# Patient Record
Sex: Male | Born: 1940 | Race: White | Hispanic: No | Smoking: Never smoker
Health system: Southern US, Community
[De-identification: ages and names within clinical notes are randomized; demographics above are authoritative.]

## PROBLEM LIST (undated history)

## (undated) ENCOUNTER — Emergency Department (HOSPITAL_BASED_OUTPATIENT_CLINIC_OR_DEPARTMENT_OTHER): Admission: EM | Payer: Medicare Other | Source: Home / Self Care

## (undated) DIAGNOSIS — E785 Hyperlipidemia, unspecified: Secondary | ICD-10-CM

## (undated) DIAGNOSIS — Z9989 Dependence on other enabling machines and devices: Secondary | ICD-10-CM

## (undated) DIAGNOSIS — F0390 Unspecified dementia without behavioral disturbance: Secondary | ICD-10-CM

## (undated) DIAGNOSIS — G4733 Obstructive sleep apnea (adult) (pediatric): Secondary | ICD-10-CM

## (undated) DIAGNOSIS — I10 Essential (primary) hypertension: Secondary | ICD-10-CM

## (undated) HISTORY — DX: Unspecified dementia, unspecified severity, without behavioral disturbance, psychotic disturbance, mood disturbance, and anxiety: F03.90

## (undated) HISTORY — DX: Obstructive sleep apnea (adult) (pediatric): G47.33

## (undated) HISTORY — DX: Hyperlipidemia, unspecified: E78.5

## (undated) HISTORY — PX: THYROID SURGERY: SHX805

---
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
## 1998-10-15 ENCOUNTER — Ambulatory Visit (HOSPITAL_COMMUNITY): Admission: RE | Admit: 1998-10-15 | Discharge: 1998-10-15 | Payer: Self-pay | Admitting: *Deleted

## 2001-01-08 ENCOUNTER — Encounter (INDEPENDENT_AMBULATORY_CARE_PROVIDER_SITE_OTHER): Payer: Self-pay | Admitting: Specialist

## 2001-01-08 ENCOUNTER — Other Ambulatory Visit: Admission: RE | Admit: 2001-01-08 | Discharge: 2001-01-08 | Payer: Self-pay | Admitting: Urology

## 2004-05-23 ENCOUNTER — Encounter (INDEPENDENT_AMBULATORY_CARE_PROVIDER_SITE_OTHER): Payer: Self-pay | Admitting: *Deleted

## 2004-05-23 ENCOUNTER — Ambulatory Visit (HOSPITAL_COMMUNITY): Admission: RE | Admit: 2004-05-23 | Discharge: 2004-05-23 | Payer: Self-pay | Admitting: *Deleted

## 2005-07-21 ENCOUNTER — Ambulatory Visit (HOSPITAL_COMMUNITY): Admission: RE | Admit: 2005-07-21 | Discharge: 2005-07-21 | Payer: Self-pay | Admitting: *Deleted

## 2005-07-21 ENCOUNTER — Encounter (INDEPENDENT_AMBULATORY_CARE_PROVIDER_SITE_OTHER): Payer: Self-pay | Admitting: *Deleted

## 2008-06-28 ENCOUNTER — Ambulatory Visit (HOSPITAL_COMMUNITY): Admission: RE | Admit: 2008-06-28 | Discharge: 2008-06-28 | Payer: Self-pay | Admitting: *Deleted

## 2008-06-28 ENCOUNTER — Encounter (INDEPENDENT_AMBULATORY_CARE_PROVIDER_SITE_OTHER): Payer: Self-pay | Admitting: *Deleted

## 2009-01-15 ENCOUNTER — Emergency Department (HOSPITAL_COMMUNITY): Admission: EM | Admit: 2009-01-15 | Discharge: 2009-01-15 | Payer: Self-pay | Admitting: Emergency Medicine

## 2010-03-28 ENCOUNTER — Encounter: Admission: RE | Admit: 2010-03-28 | Discharge: 2010-03-28 | Payer: Self-pay | Admitting: General Surgery

## 2011-01-21 LAB — URINALYSIS, ROUTINE W REFLEX MICROSCOPIC
Bilirubin Urine: NEGATIVE
Glucose, UA: NEGATIVE mg/dL
Leukocytes, UA: NEGATIVE
Urobilinogen, UA: 0.2 mg/dL (ref 0.0–1.0)
pH: 5 (ref 5.0–8.0)

## 2011-02-24 NOTE — Op Note (Signed)
NAMEDONNY, HEFFERN NO.:  [id number]   MEDICAL RECORD NO.:  [id number]          PATIENT TYPE:  AMB   LOCATION:  ENDO                         FACILITY:  [facility]   PHYSICIAN:  Georgiana Spinner, M.D.    DATE OF BIRTH:  03/19/1941   DATE OF PROCEDURE:  06/28/2008  DATE OF DISCHARGE:                               OPERATIVE REPORT   PROCEDURE:  Upper endoscopy with biopsy.   INDICATIONS:  GERD with Barrett's esophagus.   ANESTHESIA:  Fentanyl 60 mcg, Versed 6 mg, Phenergan 12 mg.   PROCEDURE:  With the patient mildly sedated in the left lateral  decubitus position, the Pentax videoscopic endoscope was inserted and  passed under direct vision through the esophagus which appeared normal  until we reached distal esophagus there were changes of Barrett's  photographed and biopsied.  We entered into the stomach.  Fundus, body,  antrum, duodenal bulb, second portion duodenum were visualized.  From  this point the endoscope was slowly withdrawn taking circumferential  views of duodenal mucosa until the endoscope had been pulled back into  the stomach and placed on retroflexion to view the stomach from below.  The endoscope was straightened and withdrawn taking circumferential  views remaining gastric and esophageal mucosa.  The patient's vital  signs, pulse oximeter remained stable.  The patient tolerated procedure  well without apparent complication.   FINDINGS:  Barrett's esophagus biopsied.  Await biopsy report.  The  patient will call me for results and follow-up with me as an outpatient.           ______________________________  Georgiana Spinner, M.D.     GMO/MEDQ  D:  06/28/2008  T:  06/30/2008  Job:  [id number]

## 2011-02-27 NOTE — Op Note (Signed)
NAMEBEAUX, WEDEMEYER NO.:  [id number]   MEDICAL RECORD NO.:  [id number]          PATIENT TYPE:  AMB   LOCATION:  ENDO                         FACILITY:  [facility]   PHYSICIAN:  Georgiana Spinner, M.D.    DATE OF BIRTH:  04/27/1941   DATE OF PROCEDURE:  DATE OF DISCHARGE:                                 OPERATIVE REPORT   PROCEDURE:  Upper endoscopy with biopsy.   INDICATIONS:  GERD.   ANESTHESIA:  Demerol 60, Versed 6 mg, Phenergan 12.5 mg was given  preprocedure to prevent nausea.   PROCEDURE:  With the patient mildly sedated in the left lateral decubitus  position, the Olympus videoscopic endoscope was inserted in the mouth and  passed under direct vision through the esophagus which appeared normal,  until we  reached distal esophagus and there was a question of a tongue of  Barrett's esophagus photographed and biopsied.  We entered into the stomach.  Fundus, body, antrum, duodenal bulb, second portion duodenum were  visualized. From this point, the endoscope was slowly withdrawn taking  circumferential views of duodenal mucosa until the endoscope had been pulled  back into the stomach and placed in retroflexion to view the stomach from  below.  The endoscope was then straightened and withdrawn, taking  circumferential views of the remaining gastric and esophageal mucosa,  stopping in the body of the stomach to photograph and biopsy an erythematous  area that had a measles-like erythematous pattern. The patient's vital  signs, pulse oximeter remained stable. The patient tolerated procedure well  without apparent complications.   FINDINGS:  Question of Barrett's esophagus, biopsied.  Erythematous gastric  area, biopsied. Await biopsy reports. The patient will call me for results  and follow-up with me as an outpatient.           ______________________________  Georgiana Spinner, M.D.     GMO/MEDQ  D:  07/21/2005  T:  07/21/2005  Job:  [id number]

## 2011-02-27 NOTE — Op Note (Signed)
NAME:  Edward Conway, Edward Conway                       ACCOUNT NO.:  [id number]   MEDICAL RECORD NO.:  [id number]                   PATIENT TYPE:  AMB   LOCATION:  ENDO                                 FACILITY:  [facility]   PHYSICIAN:  Georgiana Spinner, M.D.                 DATE OF BIRTH:  1941/08/09   DATE OF PROCEDURE:  05/23/2004  DATE OF DISCHARGE:                                 OPERATIVE REPORT   PROCEDURE:  Colonoscopy.   INDICATIONS FOR PROCEDURE:  Colon polyps, colon cancer screening and family  history of colon cancer.   ANESTHESIA:  None further given.   DESCRIPTION OF PROCEDURE:  With the patient mildly sedated in the left  lateral decubitus position, a rectal exam was performed which was  unremarkable.  Subsequently the Olympus videoscopic colonoscope was inserted  in the rectum and passed under direct vision to the cecum identified by the  ileocecal valve and appendiceal orifice both of which were photographed.  From this point after clearing the cecum of fecal debris, the colonoscope  was slowly withdrawn taking circumferential views of the colonic mucosa  stopping only in the rectum which appeared normal on direct and retroflexed  view. The endoscope was straightened and withdrawn. The patient's vital  signs and pulse oximeter remained stable. The patient tolerated the  procedure well without apparent complications.   FINDINGS:  Unremarkable colonoscopic examination.   PLAN:  Repeat examination in five years.                                               Georgiana Spinner, M.D.    GMO/MEDQ  D:  05/23/2004  T:  05/23/2004  Job:  [id number]

## 2011-02-27 NOTE — Op Note (Signed)
NAME:  Edward Conway, Edward Conway                       ACCOUNT NO.:  [id number]   MEDICAL RECORD NO.:  [id number]                   PATIENT TYPE:  AMB   LOCATION:  ENDO                                 [facility]ACILITY[facility]   PHYSICIAN:  Georgiana Spinner, M.D.                 DATE OF BIRTH:  February 06, 1941   DATE OF PROCEDURE:  DATE OF DISCHARGE:                                 OPERATIVE REPORT   PROCEDURE:  Upper endoscopy.   INDICATIONS FOR PROCEDURE:  Gastroesophageal reflux disease.   ANESTHESIA:  Demerol 50, Versed 5, Phenergan 25 mg.   DESCRIPTION OF PROCEDURE:  With the patient mildly sedated in the left  lateral decubitus position, the Olympus videoscopic endoscope was inserted  in the mouth, passed under direct vision through the esophagus which  appeared normal until we reached the distal esophagus and there was a  question of Barrett's photographed and biopsied. We entered into the  stomach.  The fundus, body, antrum, duodenal bulb, second portion of the  duodenum all appeared normal. From this point, the endoscope was slowly  withdrawn taking circumferential views of the duodenal mucosa until the  endoscope was pulled back in the stomach, placed in retroflexion to view the  stomach from below. The endoscope was straightened and withdrawn, taking  circumferential views of the remaining gastric and esophageal mucosa.  The  patient's vital signs and pulse oximeter remained stable.  The patient  tolerated the procedure well without apparent complications.   FINDINGS:  Question of short segment Barrett's esophagus biopsied. Await  biopsy report. The patient will call me for results and followup with me as  an outpatient.                                               Georgiana Spinner, M.D.    GMO/MEDQ  D:  05/23/2004  T:  05/23/2004  Job:  [id number]

## 2013-03-22 ENCOUNTER — Other Ambulatory Visit: Payer: Self-pay | Admitting: Endocrinology

## 2013-03-22 DIAGNOSIS — R350 Frequency of micturition: Secondary | ICD-10-CM

## 2013-03-22 DIAGNOSIS — R109 Unspecified abdominal pain: Secondary | ICD-10-CM

## 2013-03-24 ENCOUNTER — Ambulatory Visit
Admission: RE | Admit: 2013-03-24 | Discharge: 2013-03-24 | Disposition: A | Discharge status: Home / Self Care | Payer: Medicare Other | Source: Ambulatory Visit | Attending: Endocrinology | Admitting: Endocrinology

## 2013-03-24 DIAGNOSIS — R109 Unspecified abdominal pain: Secondary | ICD-10-CM

## 2013-03-24 DIAGNOSIS — R350 Frequency of micturition: Secondary | ICD-10-CM

## 2013-03-24 MED ORDER — IOHEXOL 300 MG/ML  SOLN
100.0000 mL | Freq: Once | INTRAMUSCULAR | Status: AC | PRN
  Administered 2013-03-24: 100 mL via INTRAVENOUS

## 2014-02-02 ENCOUNTER — Other Ambulatory Visit: Payer: Self-pay | Admitting: Gastroenterology

## 2016-11-03 ENCOUNTER — Other Ambulatory Visit: Payer: Self-pay | Admitting: Dermatology

## 2017-02-23 ENCOUNTER — Emergency Department
Admission: EM | Admit: 2017-02-23 | Discharge: 2017-02-23 | Disposition: A | Discharge status: Home / Self Care | Payer: Medicare Other | Attending: Emergency Medicine | Admitting: Emergency Medicine

## 2017-02-23 ENCOUNTER — Encounter: Payer: Self-pay | Admitting: Emergency Medicine

## 2017-02-23 DIAGNOSIS — Y929 Unspecified place or not applicable: Secondary | ICD-10-CM | POA: Diagnosis not present

## 2017-02-23 DIAGNOSIS — H5789 Other specified disorders of eye and adnexa: Secondary | ICD-10-CM

## 2017-02-23 DIAGNOSIS — T1591XA Foreign body on external eye, part unspecified, right eye, initial encounter: Secondary | ICD-10-CM

## 2017-02-23 DIAGNOSIS — X58XXXA Exposure to other specified factors, initial encounter: Secondary | ICD-10-CM | POA: Diagnosis not present

## 2017-02-23 DIAGNOSIS — T1501XA Foreign body in cornea, right eye, initial encounter: Secondary | ICD-10-CM | POA: Diagnosis present

## 2017-02-23 DIAGNOSIS — Y9389 Activity, other specified: Secondary | ICD-10-CM | POA: Diagnosis not present

## 2017-02-23 DIAGNOSIS — I10 Essential (primary) hypertension: Secondary | ICD-10-CM | POA: Insufficient documentation

## 2017-02-23 DIAGNOSIS — Y999 Unspecified external cause status: Secondary | ICD-10-CM | POA: Insufficient documentation

## 2017-02-23 HISTORY — DX: Essential (primary) hypertension: I10

## 2017-02-23 MED ORDER — FLUORESCEIN SODIUM 0.6 MG OP STRP
1.0000 | ORAL_STRIP | Freq: Once | OPHTHALMIC | Status: AC
  Administered 2017-02-23: 1 via OPHTHALMIC
  Filled 2017-02-23: qty 1

## 2017-02-23 MED ORDER — TETRACAINE HCL 0.5 % OP SOLN
1.0000 [drp] | Freq: Once | OPHTHALMIC | Status: AC
  Administered 2017-02-23: 1 [drp] via OPHTHALMIC
  Filled 2017-02-23: qty 2

## 2017-02-23 MED ORDER — ERYTHROMYCIN 5 MG/GM OP OINT: TOPICAL_OINTMENT | Freq: Three times a day (TID) | OPHTHALMIC | 0 refills | Status: AC

## 2017-02-23 NOTE — ED Triage Notes (Signed)
Pt states was mowing grass today and reports felt something in his eye tonight. States has tried to flush it out with no relief.

## 2017-02-23 NOTE — ED Provider Notes (Signed)
[facility] Emergency Department Provider Note   ____________________________________________   First MD Initiated Contact with Patient 02/23/17 [phone]     (approximate)  I have reviewed the triage vital signs and the nursing notes.   HISTORY  Chief Complaint Eye Pain    HPI Edward Conway is a 76 y.o. male who comes into the hospital today with a foreign body sensation in his right eye. The patient states he was cutting grass this evening at 8 PM. When he walked back over the area that he mowed he may have gotten something in his right eye. The patient states that 11 PM he felt as though something was in his right eye. He tried using some allergy eye drops and put his head under water and opened his eyes. He reports that it didn't help. For multiple hours he was feeling as though there was something stuck in his eye. The patient denies any blurred vision or any vision loss. The patient reports though that now after sitting in the waiting room for multiple hours he no longer has sensation in his eye. He decided still to be evaluated for foreign body and a possible corneal abrasion. The patient denies eye pain at this time.   Past Medical History:  Diagnosis Date  . Hypertension     There are no active problems to display for this patient.   Past Surgical History:  Procedure Laterality Date  . THYROID SURGERY      Prior to Admission medications   Medication Sig Start Date End Date Taking? Authorizing Provider  erythromycin [facility]) ophthalmic ointment Place into the right eye 3 (three) times daily. Place a 1/2 inch ribbon of ointment into the lower eyelid. 02/23/17 02/26/17  Rebecka Apley, MD    Allergies Patient has no known allergies.  No family history on file.  Social History Social History  Substance Use Topics  . Smoking status: Never Smoker  . Smokeless tobacco: Never Used  . Alcohol use Yes    Review of  Systems  Constitutional: No fever/chills Eyes: Right eye foreign body sensation ENT: No sore throat. Cardiovascular: Denies chest pain. Respiratory: Denies shortness of breath. Gastrointestinal: No abdominal pain.  No nausea, no vomiting.  No diarrhea.  No constipation. Genitourinary: Negative for dysuria. Musculoskeletal: Negative for back pain. Skin: Negative for rash. Neurological: Negative for headaches, focal weakness or numbness.   ____________________________________________   PHYSICAL EXAM:  VITAL SIGNS: ED Triage Vitals  Enc Vitals Group     BP 02/23/17 0047 (!) 157/95     Pulse Rate 02/23/17 0047 73     Resp 02/23/17 0047 18     Temp 02/23/17 0047 97.8 F (36.6 C)     Temp Source 02/23/17 0047 Oral     SpO2 02/23/17 0047 97 %     Weight 02/23/17 0046 160 lb (72.6 kg)     Height 02/23/17 0046 5\' 7"  (1.702 m)     Head Circumference --      Peak Flow --      Pain Score 02/23/17 0046 3     Pain Loc --      Pain Edu? --      Excl. in GC? --     Constitutional: Alert and oriented. Well appearing and in mild distress. Eyes: Conjunctivae are normal. PERRL. EOMI. there was no uptake to the patient's right eye with fluorescein stain. No foreign body noted on visual inspection nor on the eyelid. Head: Atraumatic. Nose:  No congestion/rhinnorhea. Mouth/Throat: Mucous membranes are moist.  Oropharynx non-erythematous. Cardiovascular: Normal rate, regular rhythm. Grossly normal heart sounds.  Good peripheral circulation. Respiratory: Normal respiratory effort.  No retractions. Lungs CTAB. Gastrointestinal: Soft and nontender. No distention. Positive bowel sounds Musculoskeletal: No lower extremity tenderness nor edema.  Neurologic:  Normal speech and language.  Skin:  Skin is warm, dry and intact.  Psychiatric: Mood and affect are normal.   ____________________________________________   LABS (all labs ordered are listed, but only abnormal results are  displayed)  Labs Reviewed - No data to display ____________________________________________  EKG  none ____________________________________________  RADIOLOGY  none ____________________________________________   PROCEDURES  Procedure(s) performed: None  Procedures  Critical Care performed: No  ____________________________________________   INITIAL IMPRESSION / ASSESSMENT AND PLAN / ED COURSE  Pertinent labs & imaging results that were available during my care of the patient were reviewed by me and considered in my medical decision making (see chart for details).  This is a 76 year old male who comes into the hospital today with a right eye foreign body. The patient reports that he has been waiting for some time and during the time in the waiting room the foreign body sensation improved. The patient reports that his eye feels irritated but he doesn't feel like there is anything in his eye. On exam I did not notice anything in his eye. I will discharge the patient to home and have him follow-up with his ophthalmologist. The patient has no further questions or concerns at this time.      ____________________________________________   FINAL CLINICAL IMPRESSION(S) / ED DIAGNOSES  Final diagnoses:  Eye irritation  Foreign body of right eye, initial encounter      NEW MEDICATIONS STARTED DURING THIS VISIT:  Discharge Medication List as of 02/23/2017  5:01 AM    START taking these medications   Details  erythromycin (ROMYCIN) ophthalmic ointment Place into the right eye 3 (three) times daily. Place a 1/2 inch ribbon of ointment into the lower eyelid., Starting Tue 02/23/2017, Until Fri 02/26/2017, Print         Note:  This document was prepared using Dragon voice recognition software and may include unintentional dictation errors.    Rebecka ApleyWebster, Allison P, MD 02/23/17 [phone]

## 2017-11-01 DIAGNOSIS — E785 Hyperlipidemia, unspecified: Secondary | ICD-10-CM | POA: Insufficient documentation

## 2017-11-01 DIAGNOSIS — E039 Hypothyroidism, unspecified: Secondary | ICD-10-CM | POA: Insufficient documentation

## 2017-11-01 DIAGNOSIS — I10 Essential (primary) hypertension: Secondary | ICD-10-CM | POA: Insufficient documentation

## 2019-06-26 ENCOUNTER — Emergency Department (HOSPITAL_BASED_OUTPATIENT_CLINIC_OR_DEPARTMENT_OTHER): Payer: Medicare Other

## 2019-06-26 ENCOUNTER — Encounter (HOSPITAL_BASED_OUTPATIENT_CLINIC_OR_DEPARTMENT_OTHER): Payer: Self-pay

## 2019-06-26 ENCOUNTER — Emergency Department (HOSPITAL_BASED_OUTPATIENT_CLINIC_OR_DEPARTMENT_OTHER)
Admission: EM | Admit: 2019-06-26 | Discharge: 2019-06-26 | Disposition: A | Discharge status: Home / Self Care | Payer: Medicare Other | Attending: Emergency Medicine | Admitting: Emergency Medicine

## 2019-06-26 ENCOUNTER — Other Ambulatory Visit: Payer: Self-pay

## 2019-06-26 ENCOUNTER — Emergency Department (HOSPITAL_COMMUNITY): Payer: Medicare Other

## 2019-06-26 DIAGNOSIS — E860 Dehydration: Secondary | ICD-10-CM | POA: Diagnosis not present

## 2019-06-26 DIAGNOSIS — I10 Essential (primary) hypertension: Secondary | ICD-10-CM | POA: Insufficient documentation

## 2019-06-26 DIAGNOSIS — R270 Ataxia, unspecified: Secondary | ICD-10-CM | POA: Diagnosis not present

## 2019-06-26 DIAGNOSIS — Z79899 Other long term (current) drug therapy: Secondary | ICD-10-CM | POA: Diagnosis not present

## 2019-06-26 DIAGNOSIS — E079 Disorder of thyroid, unspecified: Secondary | ICD-10-CM | POA: Diagnosis not present

## 2019-06-26 DIAGNOSIS — R42 Dizziness and giddiness: Secondary | ICD-10-CM

## 2019-06-26 LAB — COMPREHENSIVE METABOLIC PANEL
ALT: 20 U/L (ref 0–44)
AST: 23 U/L (ref 15–41)
Albumin: 4.3 g/dL (ref 3.5–5.0)
Alkaline Phosphatase: 61 U/L (ref 38–126)
Anion gap: 10 (ref 5–15)
BUN: 13 mg/dL (ref 8–23)
CO2: 27 mmol/L (ref 22–32)
Calcium: 10.2 mg/dL (ref 8.9–10.3)
Chloride: 102 mmol/L (ref 98–111)
Creatinine, Ser: 0.96 mg/dL (ref 0.61–1.24)
GFR calc Af Amer: >60 mL/min (ref >60)
GFR calc non Af Amer: >60 mL/min (ref >60)
Glucose, Bld: 143 mg/dL — ABNORMAL HIGH (ref 70–99)
Potassium: 3.9 mmol/L (ref 3.5–5.1)
Sodium: 139 mmol/L (ref 135–145)
Total Bilirubin: 1.1 mg/dL (ref 0.3–1.2)
Total Protein: 7.5 g/dL (ref 6.5–8.1)

## 2019-06-26 LAB — URINALYSIS, ROUTINE W REFLEX MICROSCOPIC
Bilirubin Urine: NEGATIVE
Glucose, UA: NEGATIVE mg/dL
Hgb urine dipstick: NEGATIVE
Ketones, ur: NEGATIVE mg/dL
Leukocytes,Ua: NEGATIVE
Nitrite: NEGATIVE
Protein, ur: NEGATIVE mg/dL
Specific Gravity, Urine: 1.02 (ref 1.005–1.030)
pH: 7.5 (ref 5.0–8.0)

## 2019-06-26 LAB — DIFFERENTIAL
Abs Immature Granulocytes: 0.04 10*3/uL (ref 0.00–0.07)
Basophils Absolute: 0 10*3/uL (ref 0.0–0.1)
Basophils Relative: 0 %
Eosinophils Absolute: 0 10*3/uL (ref 0.0–0.5)
Eosinophils Relative: 0 %
Immature Granulocytes: 0 %
Lymphocytes Relative: 5 %
Lymphs Abs: 0.5 10*3/uL — ABNORMAL LOW (ref 0.7–4.0)
Monocytes Absolute: 0.3 10*3/uL (ref 0.1–1.0)
Monocytes Relative: 3 %
Neutro Abs: 9.1 10*3/uL — ABNORMAL HIGH (ref 1.7–7.7)
Neutrophils Relative %: 92 %

## 2019-06-26 LAB — CBC
HCT: 52.8 % — ABNORMAL HIGH (ref 39.0–52.0)
Hemoglobin: 17.9 g/dL — ABNORMAL HIGH (ref 13.0–17.0)
MCH: 32.1 pg (ref 26.0–34.0)
MCHC: 33.9 g/dL (ref 30.0–36.0)
MCV: 94.6 fL (ref 80.0–100.0)
Platelets: 189 10*3/uL (ref 150–400)
RBC: 5.58 MIL/uL (ref 4.22–5.81)
RDW: 13.1 % (ref 11.5–15.5)
WBC: 10 10*3/uL (ref 4.0–10.5)
nRBC: 0 % (ref 0.0–0.2)

## 2019-06-26 LAB — APTT: aPTT: 21 seconds — ABNORMAL LOW (ref 24–36)

## 2019-06-26 LAB — RAPID URINE DRUG SCREEN, HOSP PERFORMED
Amphetamines: NOT DETECTED
Barbiturates: NOT DETECTED
Benzodiazepines: NOT DETECTED
Cocaine: NOT DETECTED
Opiates: NOT DETECTED
Tetrahydrocannabinol: NOT DETECTED

## 2019-06-26 LAB — PROTIME-INR
INR: 0.9 (ref 0.8–1.2)
Prothrombin Time: 12.5 seconds (ref 11.4–15.2)

## 2019-06-26 LAB — ETHANOL: Alcohol, Ethyl (B): <10 mg/dL (ref <10)

## 2019-06-26 MED ORDER — LORAZEPAM 2 MG/ML IJ SOLN
0.5000 mg | Freq: Once | INTRAMUSCULAR | Status: AC
  Administered 2019-06-26: 0.5 mg via INTRAVENOUS
  Filled 2019-06-26: qty 1

## 2019-06-26 MED ORDER — LORAZEPAM 1 MG PO TABS
1.0000 mg | ORAL_TABLET | Freq: Once | ORAL | Status: AC
  Administered 2019-06-26: 20:00:00 1 mg via ORAL
  Filled 2019-06-26: qty 1

## 2019-06-26 MED ORDER — OLMESARTAN MEDOXOMIL 40 MG PO TABS: 40.0000 mg | ORAL_TABLET | Freq: Every day | ORAL | 0 refills | Status: AC

## 2019-06-26 MED ORDER — ONDANSETRON HCL 4 MG/2ML IJ SOLN
4.0000 mg | Freq: Once | INTRAMUSCULAR | Status: AC
  Administered 2019-06-26: 14:00:00 4 mg via INTRAVENOUS
  Filled 2019-06-26: qty 2

## 2019-06-26 MED ORDER — LORAZEPAM 0.5 MG PO TABS: 1.0000 mg | ORAL_TABLET | Freq: Three times a day (TID) | ORAL | 0 refills | Status: DC | PRN

## 2019-06-26 NOTE — ED Provider Notes (Addendum)
Patient's MRI without acute abnormality.  No evidence of stroke or press.  Patient feels much better after 0.5 mg of Ativan and states his dizziness and nausea has resolved.  Will discharge patient home with 0.5 mg Ativan to take as needed.  He was instructed to follow-up with his PCP in a week if symptoms continue.  8:19 PM Patient was symptom-free however when he stood up and walked to the bathroom he developed recurrent dizziness and had some dry heaving.  However he was able to walk to the bathroom with a quick steady gait without any evidence of ataxia.  Patient is also noted to be persistently hypertensive here however he stopped taking his Benicar 5 months ago.  Patient was given a refill of his Benicar and was offered admission for symptom control to ensure he felt better before discharge home but he was insistent that he wanted to go home.  Encouraged him to return if his symptoms were not controlled at home.   Blanchie Dessert, MD 06/26/19 1919    Blanchie Dessert, MD 06/26/19 2020

## 2019-06-26 NOTE — ED Notes (Signed)
Pt began to dryheave upon standing. MD alerted to nausea and high blood pressure. Pt insisting to go home. Plan of care and discharge discussed with daughter. MD aware of discharge.

## 2019-06-26 NOTE — ED Notes (Signed)
According to patient, he stopped taking his BP medication a few months ago because he was afraid to the side effects for the medication.

## 2019-06-26 NOTE — ED Provider Notes (Signed)
[facility] EMERGENCY DEPARTMENT Provider Note   CSN: [id number] Arrival date & time: 06/26/19  1229     History   Chief Complaint Chief Complaint  Patient presents with  . Dizziness    HPI Edward Conway is a 78 y.o. male with history of hypertension and thyroid disorder presents for evaluation of acute onset, persistent dizziness since awakening yesterday morning at around 6 AM.  He reports that he went to bed Saturday night asymptomatic but when he awoke he felt "woozy ".  He describes the sensation as feeling unsteady, denies lightheadedness or room spinning sensation.  No vision changes.  Since then he has had multiple episodes of nonbloody nonbilious emesis.  His symptoms are not positional, do not worsen with rapid turning of the head.  He reports that he took his blood pressure yesterday and today and noted that they were elevated.  He did self discontinue his 3 blood pressure medications a few months ago because he was concerned about side effects.  Denies any numbness or weakness to the extremities, abdominal pain, chest pain, or shortness of breath.  Has had decreased appetite due to his persistent nausea.     The history is provided by the patient.    Past Medical History:  Diagnosis Date  . Hypertension     There are no active problems to display for this patient.   Past Surgical History:  Procedure Laterality Date  . THYROID SURGERY          Home Medications    Prior to Admission medications   Not on File    Family History No family history on file.  Social History Social History   Tobacco Use  . Smoking status: Never Smoker  . Smokeless tobacco: Never Used  Substance Use Topics  . Alcohol use: Yes    Comment: occ  . Drug use: No     Allergies   Patient has no known allergies.   Review of Systems Review of Systems  Constitutional: Negative for chills and fever.  Eyes: Negative for photophobia and visual disturbance.   Respiratory: Negative for cough and shortness of breath.   Cardiovascular: Negative for chest pain.  Gastrointestinal: Positive for nausea and vomiting. Negative for abdominal pain and diarrhea.  Neurological: Positive for dizziness. Negative for syncope, weakness, light-headedness, numbness and headaches.  All other systems reviewed and are negative.    Physical Exam Updated Vital Signs BP (!) 179/128   Pulse (!) 102   Temp 97.6 F (36.4 C) (Oral)   Resp 18   Ht 5\' 7"  (1.702 m)   Wt 75.8 kg   SpO2 98%   BMI 26.16 kg/m   Physical Exam Vitals signs and nursing note reviewed.  Constitutional:      General: He is not in acute distress.    Appearance: He is well-developed.  HENT:     Head: Normocephalic and atraumatic.  Eyes:     General:        Right eye: No discharge.        Left eye: No discharge.     Extraocular Movements: Extraocular movements intact.     Conjunctiva/sclera: Conjunctivae normal.     Pupils: Pupils are equal, round, and reactive to light.  Neck:     Musculoskeletal: Normal range of motion and neck supple.     Vascular: No JVD.     Trachea: No tracheal deviation.  Cardiovascular:     Rate and Rhythm: Normal rate and regular  rhythm.     Pulses: Normal pulses.  Pulmonary:     Effort: Pulmonary effort is normal.     Breath sounds: Normal breath sounds.  Abdominal:     General: Bowel sounds are normal. There is no distension.     Palpations: Abdomen is soft.     Tenderness: There is no abdominal tenderness. There is no guarding or rebound.  Skin:    General: Skin is warm and dry.     Findings: No erythema.  Neurological:     Mental Status: He is alert.     Cranial Nerves: No cranial nerve deficit.     Sensory: No sensory deficit.     Motor: No weakness.     Coordination: Coordination normal.     Comments: Mental Status:  Alert, thought content appropriate, able to give a coherent history. Speech fluent without evidence of aphasia. Able to follow  2 step commands without difficulty.  Cranial Nerves:  II:  Peripheral visual fields grossly normal, pupils equal, round, reactive to light III,IV, VI: ptosis not present, extra-ocular motions intact bilaterally  V,VII: smile symmetric, facial light touch sensation equal VIII: hearing grossly normal to voice  X: uvula elevates symmetrically  XI: bilateral shoulder shrug symmetric and strong XII: midline tongue extension without fassiculations Motor:  Normal tone. 5/5 strength of BUE and BLE major muscle groups including strong and equal grip strength and dorsiflexion/plantar flexion, no pronator drift Sensory: light touch normal in all extremities. Cerebellar: normal finger-to-nose with bilateral upper extremities, romberg sign absent Gait: normal gait and balance. Able to walk on toes and heels with ease.     Psychiatric:        Behavior: Behavior normal.      ED Treatments / Results  Labs (all labs ordered are listed, but only abnormal results are displayed) Labs Reviewed  APTT - Abnormal; Notable for the following components:      Result Value   aPTT 21 (*)    All other components within normal limits  CBC - Abnormal; Notable for the following components:   Hemoglobin 17.9 (*)    HCT 52.8 (*)    All other components within normal limits  DIFFERENTIAL - Abnormal; Notable for the following components:   Neutro Abs 9.1 (*)    Lymphs Abs 0.5 (*)    All other components within normal limits  COMPREHENSIVE METABOLIC PANEL - Abnormal; Notable for the following components:   Glucose, Bld 143 (*)    All other components within normal limits  ETHANOL  PROTIME-INR  URINALYSIS, ROUTINE W REFLEX MICROSCOPIC  RAPID URINE DRUG SCREEN, HOSP PERFORMED    EKG EKG Interpretation  Date/Time:  Monday June 26 2019 13:06:33 EDT Ventricular Rate:  99 PR Interval:    QRS Duration: 97 QT Interval:  333 QTC Calculation: 428 R Axis:   -97 Text Interpretation:  Sinus rhythm  Prolonged PR interval Left atrial enlargement Abnormal R-wave progression, late transition Inferior infarct, old No old tracing to compare Confirmed by Pricilla LovelessGoldston, Scott [phone]([id number]) on 06/26/2019 1:46:36 PM   Radiology Ct Head Wo Contrast  Result Date: 06/26/2019 CLINICAL DATA:  Focal neurologic deficit for greater than 6 hours. EXAM: CT HEAD WITHOUT CONTRAST TECHNIQUE: Contiguous axial images were obtained from the base of the skull through the vertex without intravenous contrast. COMPARISON:  None. FINDINGS: Brain: No evidence of acute infarction, hemorrhage, hydrocephalus, extra-axial collection or mass lesion/mass effect. There is mild-to-moderate cerebral volume loss with associated ex vacuo ventricular dilatation. Vascular: No hyperdense  vessel or unexpected calcification. Skull: Normal. Negative for fracture or focal lesion. Sinuses/Orbits: No acute finding. Other: None. IMPRESSION: No acute intracranial process. Electronically Signed   By: Romona Curls M.D.   On: 06/26/2019 14:31    Procedures Procedures (including critical care time)  Medications Ordered in ED Medications  ondansetron (ZOFRAN) injection 4 mg (4 mg Intravenous Given 06/26/19 1428)     Initial Impression / Assessment and Plan / ED Course  I have reviewed the triage vital signs and the nursing notes.  Pertinent labs & imaging results that were available during my care of the patient were reviewed by me and considered in my medical decision making (see chart for details).        Patient presenting with daughter for evaluation of acute onset, persistent disequilibrium sensation since awakening yesterday morning at 6 AM.  He is afebrile, persistently hypertensive and intermittently tachycardic in the ED.  Overall well-appearing.  Physical examination is reassuring and he is ambulatory without difficulty.  No dysmetria on examination.  However with complaint of persistent dizziness and hypertension after self-discontinuing his  blood pressure medications a few months ago, concern for posterior circulation stroke.  Lab work today reviewed by me shows no leukocytosis, elevated hemoglobin and hematocrit which could be secondary to dehydration/hemoconcentration from persistent vomiting.  No renal insufficiency, no metabolic derangements noted.  He is mildly hyperglycemic.  EKG today shows no acute ischemic abnormalities.  Head CT shows no acute intracranial abnormalities.  He will need an MRI to rule out acute infarct so will need to be transferred to [facility] emergency department for this.  Dr. Criss Alvine spoke with Dr. Hyacinth Meeker who accepts transfer. Patient resting comfortably in no apparent distress on re-evaluation. He will be transferred to [facility] POV with his daughter driving him there.   Final Clinical Impressions(s) / ED Diagnoses   Final diagnoses:  Dehydration    ED Discharge Orders    None       Jeanie Sewer, PA-C 06/26/19 1456    Pricilla Loveless, MD 06/26/19 1513

## 2019-06-26 NOTE — ED Triage Notes (Signed)
Pt c/o dizziness started yesterday-n/v started today-pt states he stopped taking his BP meds 5-6 months-NAD-steady gait

## 2019-06-26 NOTE — Discharge Instructions (Addendum)
Some days will be worse than others.  On days that you have severe symptoms of dizziness you can take the medication as needed.  Make sure you are using a walker or other assistive device if you feel unsteady so that you do not fall.  If you are still having symptoms in 1 week we you should follow-up with your regular doctor as you may need referral to ENT.

## 2019-06-26 NOTE — ED Notes (Signed)
All appropriate discharge materials reviewed at length with patient. Time for questions provided. Pt has no other questions at this time and verbalizes understanding of all provided materials.  

## 2019-07-05 ENCOUNTER — Other Ambulatory Visit: Payer: Self-pay

## 2019-07-05 ENCOUNTER — Encounter: Payer: Self-pay | Admitting: Cardiology

## 2019-07-05 ENCOUNTER — Ambulatory Visit (INDEPENDENT_AMBULATORY_CARE_PROVIDER_SITE_OTHER): Payer: Medicare Other | Admitting: Cardiology

## 2019-07-05 VITALS — BP 140/108 | HR 82 | Temp 96.9°F | Ht 67.0 in | Wt 167.9 lb

## 2019-07-05 DIAGNOSIS — R42 Dizziness and giddiness: Secondary | ICD-10-CM | POA: Diagnosis not present

## 2019-07-05 DIAGNOSIS — R9431 Abnormal electrocardiogram [ECG] [EKG]: Secondary | ICD-10-CM | POA: Diagnosis not present

## 2019-07-05 DIAGNOSIS — I10 Essential (primary) hypertension: Secondary | ICD-10-CM | POA: Diagnosis not present

## 2019-07-05 DIAGNOSIS — E7800 Pure hypercholesterolemia, unspecified: Secondary | ICD-10-CM | POA: Diagnosis not present

## 2019-07-05 MED ORDER — METOPROLOL TARTRATE 25 MG PO TABS: 25.0000 mg | ORAL_TABLET | Freq: Two times a day (BID) | ORAL | 2 refills | Status: DC

## 2019-07-05 MED ORDER — AMLODIPINE BESYLATE 5 MG PO TABS: 5.0000 mg | ORAL_TABLET | Freq: Every day | ORAL | 2 refills | Status: DC

## 2019-07-05 NOTE — Progress Notes (Signed)
Primary Physician:  Deland Pretty, MD   Patient ID: Edward Conway, male    DOB: [birth year], 78 y.o.   MRN: [id number]  Subjective:    Chief Complaint  Patient presents with  . New Patient (Initial Visit)    abn EKG  . Dizziness  . Nausea  . Emesis    HPI: Edward Conway  is a 78 y.o. male  with hypertension, hyperlipidemia, hypothyroidism, referred to [place] for evaluation of abnormal EKG.   He reports last week having dizziness, nausea, and vomiting for 2 days. He was evaluated in the ER at [facility] and transferred to [facility] hospital for MRI to rule cerebellar CVA, which was negative for acute CVA. EKG was noted to be abnormal and BP markedly elevated, in which outpatient cardiology evaluation was recommended. Symptoms of dizziness were felt to be related to vertigo, and he was started on Meclizine. Since being on Meclizine, his symptoms have resolved.   He is very active with managing his 6 acres at home, denies any chest pain, dyspnea on exertion, claudication, syncope, palpitations, or symptoms suggestive of TIA.   No former tobacco use. Occasional alcohol use. No drug use.  Reports older brother died in his 89's of MI. Father also had questionable heart disease.   Past Medical History:  Diagnosis Date  . Hypertension     Past Surgical History:  Procedure Laterality Date  . THYROID SURGERY      Social History   Socioeconomic History  . Marital status: Widowed    Spouse name: Not on file  . Number of children: Not on file  . Years of education: Not on file  . Highest education level: Not on file  Occupational History  . Not on file  Social Needs  . Financial resource strain: Not on file  . Food insecurity    Worry: Not on file    Inability: Not on file  . Transportation needs    Medical: Not on file    Non-medical: Not on file  Tobacco Use  . Smoking status: Never Smoker  . Smokeless tobacco: Never Used  Substance and Sexual Activity  . Alcohol use:  Yes    Comment: occ  . Drug use: No  . Sexual activity: Not on file  Lifestyle  . Physical activity    Days per week: Not on file    Minutes per session: Not on file  . Stress: Not on file  Relationships  . Social Herbalist on phone: Not on file    Gets together: Not on file    Attends religious service: Not on file    Active member of club or organization: Not on file    Attends meetings of clubs or organizations: Not on file    Relationship status: Not on file  . Intimate partner violence    Fear of current or ex partner: Not on file    Emotionally abused: Not on file    Physically abused: Not on file    Forced sexual activity: Not on file  Other Topics Concern  . Not on file  Social History Narrative  . Not on file    Review of Systems  Constitution: Negative for decreased appetite, malaise/fatigue, weight gain and weight loss.  Eyes: Negative for visual disturbance.  Cardiovascular: Negative for chest pain, claudication, dyspnea on exertion, leg swelling, orthopnea, palpitations and syncope.  Respiratory: Negative for hemoptysis and wheezing.   Endocrine: Negative for cold intolerance  and heat intolerance.  Hematologic/Lymphatic: Does not bruise/bleed easily.  Skin: Negative for nail changes.  Musculoskeletal: Negative for muscle weakness and myalgias.  Gastrointestinal: Negative for abdominal pain, change in bowel habit, nausea and vomiting.  Neurological: Negative for difficulty with concentration, dizziness, focal weakness and headaches.  Psychiatric/Behavioral: Negative for altered mental status and suicidal ideas.  All other systems reviewed and are negative.     Objective:  Blood pressure (!) 140/108, pulse 82, temperature (!) 96.9 F (36.1 C), height 5\' 7"  (1.702 m), weight 167 lb 14.4 oz (76.2 kg), SpO2 96 %. Body mass index is 26.3 kg/m.    Physical Exam  Constitutional: He is oriented to person, place, and time. Vital signs are normal. He  appears well-developed and well-nourished.  HENT:  Head: Normocephalic and atraumatic.  Neck: Normal range of motion.  Cardiovascular: Normal rate, regular rhythm and intact distal pulses.  Murmur heard.  Early systolic murmur is present with a grade of 2/6 at the upper right sternal border. Pulses:      Popliteal pulses are 2+ on the right side and 2+ on the left side.       Dorsalis pedis pulses are 2+ on the right side and 2+ on the left side.       Posterior tibial pulses are 2+ on the right side and 2+ on the left side.  Bilateral acrocyanosis  Pulmonary/Chest: Effort normal and breath sounds normal. No accessory muscle usage. No respiratory distress.  Abdominal: Soft. Bowel sounds are normal.  Musculoskeletal: Normal range of motion.  Neurological: He is alert and oriented to person, place, and time.  Skin: Skin is warm and dry.  Vitals reviewed.  Radiology: No results found.  Laboratory examination:   CMP Latest Ref Rng & Units 06/26/2019  Glucose 70 - 99 mg/dL 975(P)  BUN 8 - 23 mg/dL 13  Creatinine 0.05 - 1.10 mg/dL 2.11  Sodium 173 - 567 mmol/L 139  Potassium 3.5 - 5.1 mmol/L 3.9  Chloride 98 - 111 mmol/L 102  CO2 22 - 32 mmol/L 27  Calcium 8.9 - 10.3 mg/dL 01.4  Total Protein 6.5 - 8.1 g/dL 7.5  Total Bilirubin 0.3 - 1.2 mg/dL 1.1  Alkaline Phos 38 - 126 U/L 61  AST 15 - 41 U/L 23  ALT 0 - 44 U/L 20   CBC Latest Ref Rng & Units 06/26/2019  WBC 4.0 - 10.5 K/uL 10.0  Hemoglobin 13.0 - 17.0 g/dL 17.9(H)  Hematocrit 39.0 - 52.0 % 52.8(H)  Platelets 150 - 400 K/uL 189   Lipid Panel  No results found for: CHOL, TRIG, HDL, CHOLHDL, VLDL, LDLCALC, LDLDIRECT HEMOGLOBIN A1C No results found for: HGBA1C, MPG TSH No results for input(s): TSH in the last 8760 hours.  PRN Meds:. There are no discontinued medications. Current Meds  Medication Sig  . levothyroxine (SYNTHROID) 88 MCG tablet Take 88 mcg by mouth daily before breakfast.  . LORazepam (ATIVAN) 0.5 MG  tablet Take 2 tablets (1 mg total) by mouth every 8 (eight) hours as needed (dizziness).  . meclizine (ANTIVERT) 25 MG tablet Take 25 mg by mouth 3 (three) times daily as needed for dizziness.  Marland Kitchen olmesartan (BENICAR) 40 MG tablet Take 1 tablet (40 mg total) by mouth daily.  . rosuvastatin (CRESTOR) 20 MG tablet Take 20 mg by mouth daily.    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 IRBBB. Abnormal EKG.  Recommendations:   Patient with hypertension, hyperlipidemia, hypothyroidism, referred to [place] for evaluation of abnormal EKG.  His symptoms of dizziness have resolved since being on meclizine, likely related to vertigo.  He does have markedly elevated blood pressure.  We will continue with olmesartan.  Add amlodipine 5 mg daily and also metoprolol tartrate 25 mg twice daily both for his blood pressure and cardiac protection.  His EKG is markedly abnormal suggesting inferior and anteroseptal infarct.  Will obtain exercise nuclear stress testing for further evaluation.  He will also need echocardiogram to exclude any structural abnormalities.  He is on Crestor for hyperlipidemia, recommend he continue with this.  Fortunately he has not had any chest pain or shortness of breath.  He is fairly active with maintaining 6 acres without any difficulty.  He does have acrocyanosis on physical exam, but has normal vascular exam.  My exam findings and recommendations were discussed with the patient's granddaughter as well.  All questions were answered.  I will see him back after the test for further recommendations and reevaluation.   *I have discussed this case with Dr. Jacinto Halim and he personally examined the patient and participated in formulating  the plan.*   Toniann Fail, MSN, APRN, FNP-C [facility] [place] Office: [phone] Fax: [phone]

## 2019-07-07 ENCOUNTER — Encounter: Payer: Self-pay | Admitting: Cardiology

## 2019-07-12 ENCOUNTER — Other Ambulatory Visit: Payer: Self-pay

## 2019-07-12 ENCOUNTER — Ambulatory Visit (INDEPENDENT_AMBULATORY_CARE_PROVIDER_SITE_OTHER): Payer: Medicare Other

## 2019-07-12 DIAGNOSIS — R9431 Abnormal electrocardiogram [ECG] [EKG]: Secondary | ICD-10-CM

## 2019-08-07 ENCOUNTER — Other Ambulatory Visit: Payer: Self-pay

## 2019-08-07 ENCOUNTER — Ambulatory Visit (INDEPENDENT_AMBULATORY_CARE_PROVIDER_SITE_OTHER): Payer: Medicare Other

## 2019-08-07 DIAGNOSIS — R9431 Abnormal electrocardiogram [ECG] [EKG]: Secondary | ICD-10-CM

## 2019-08-14 ENCOUNTER — Ambulatory Visit: Payer: Medicare Other | Admitting: Cardiology

## 2019-08-15 NOTE — Progress Notes (Signed)
07/31/2019: Cholesterol 151, triglycerides 118, HDL 45, LDL 82.  TSH normal.  CBC normal.  Potassium 4.1, EGFR 76/92, creatinine 1.0, CMP normal.

## 2019-08-16 ENCOUNTER — Telehealth (INDEPENDENT_AMBULATORY_CARE_PROVIDER_SITE_OTHER): Payer: Medicare Other | Admitting: Cardiology

## 2019-08-16 ENCOUNTER — Other Ambulatory Visit: Payer: Self-pay

## 2019-08-16 ENCOUNTER — Encounter: Payer: Self-pay | Admitting: Cardiology

## 2019-08-16 VITALS — BP 133/84 | HR 79 | Ht 67.0 in | Wt 170.0 lb

## 2019-08-16 DIAGNOSIS — I10 Essential (primary) hypertension: Secondary | ICD-10-CM | POA: Diagnosis not present

## 2019-08-16 DIAGNOSIS — R9431 Abnormal electrocardiogram [ECG] [EKG]: Secondary | ICD-10-CM | POA: Diagnosis not present

## 2019-08-16 DIAGNOSIS — E7800 Pure hypercholesterolemia, unspecified: Secondary | ICD-10-CM

## 2019-08-16 NOTE — Progress Notes (Signed)
Primary Physician:  Merri BrunettePharr, Walter, MD   Patient ID: Edward Conway, male    DOB: 1941-06-12, 78 y.o.   MRN: [id number]  Subjective:    Chief Complaint  Patient presents with  . Hypertension  . Hyperlipidemia  . Follow-up   This visit type was conducted due to national recommendations for restrictions regarding the COVID-19 Pandemic (e.g. social distancing).  This format is felt to be most appropriate for this patient at this time.  All issues noted in this document were discussed and addressed.  No physical exam was performed (except for noted visual exam findings with Telehealth visits).  The patient has consented to conduct a Telehealth visit and understands insurance will be billed.   I discussed the limitations of evaluation and management by telemedicine and the availability of in person appointments. The patient expressed understanding and agreed to proceed.  Virtual Visit via Video Note is as below  I connected with@, on 08/22/19 at 1130 by telephone and verified that I am speaking with the correct person using two identifiers. Unable to perform video visit as patient did not have equipment.    I have discussed with the patient regarding the safety during COVID Pandemic and steps and precautions including social distancing with the patient.    HPI: Edward Conway  is a 78 y.o. male  with hypertension, hyperlipidemia, hypothyroidism, recently evaluated by us for abnormal EKG. Patient had recently had dizziness, nausea, and vomiting for 2 days felt to be related to Vertigo.    He underwent echocardiogram and exercise nuclear stress testing. He was started on amlodipine and metoprolol at his last visit and now presents for follow up.   He has been feeling well since he was last seen by us. Tolerating medications well. He has not had any further episodes of dizziness. Daughter is present for our phone visit.  He is very active with managing his 6 acres at home, denies any  chest pain, dyspnea on exertion, claudication, syncope, palpitations, or symptoms suggestive of TIA.   No former tobacco use. Occasional alcohol use. No drug use.  Reports older brother died in his 2760's of MI. Father also had questionable heart disease.   Past Medical History:  Diagnosis Date  . Hypertension     Past Surgical History:  Procedure Laterality Date  . THYROID SURGERY      Social History   Socioeconomic History  . Marital status: Widowed    Spouse name: Not on file  . Number of children: 2  . Years of education: Not on file  . Highest education level: Not on file  Occupational History  . Not on file  Social Needs  . Financial resource strain: Not on file  . Food insecurity    Worry: Not on file    Inability: Not on file  . Transportation needs    Medical: Not on file    Non-medical: Not on file  Tobacco Use  . Smoking status: Never Smoker  . Smokeless tobacco: Never Used  Substance and Sexual Activity  . Alcohol use: Yes    Comment: occ  . Drug use: No  . Sexual activity: Not on file  Lifestyle  . Physical activity    Days per week: Not on file    Minutes per session: Not on file  . Stress: Not on file  Relationships  . Social Musicianconnections    Talks on phone: Not on file    Gets together: Not on file  Attends religious service: Not on file    Active member of club or organization: Not on file    Attends meetings of clubs or organizations: Not on file    Relationship status: Not on file  . Intimate partner violence    Fear of current or ex partner: Not on file    Emotionally abused: Not on file    Physically abused: Not on file    Forced sexual activity: Not on file  Other Topics Concern  . Not on file  Social History Narrative  . Not on file    Review of Systems  Constitution: Negative for decreased appetite, malaise/fatigue, weight gain and weight loss.  Eyes: Negative for visual disturbance.  Cardiovascular: Negative for chest pain,  claudication, dyspnea on exertion, leg swelling, orthopnea, palpitations and syncope.  Respiratory: Negative for hemoptysis and wheezing.   Endocrine: Negative for cold intolerance and heat intolerance.  Hematologic/Lymphatic: Does not bruise/bleed easily.  Skin: Negative for nail changes.  Musculoskeletal: Negative for muscle weakness and myalgias.  Gastrointestinal: Negative for abdominal pain, change in bowel habit, nausea and vomiting.  Neurological: Negative for difficulty with concentration, dizziness, focal weakness and headaches.  Psychiatric/Behavioral: Negative for altered mental status and suicidal ideas.  All other systems reviewed and are negative.     Objective:  Blood pressure 133/84, pulse 79, height 5\' 7"  (1.702 m), weight 170 lb (77.1 kg). Body mass index is 26.63 kg/m.   Physical exam not performed or limited due to virtual visit.  Patient appeared to be in no distress, Neck was supple, respiration was not labored.  Please see exam details from prior visit is as below.    Physical Exam  Constitutional: He is oriented to person, place, and time. Vital signs are normal. He appears well-developed and well-nourished.  HENT:  Head: Normocephalic and atraumatic.  Neck: Normal range of motion.  Cardiovascular: Normal rate, regular rhythm and intact distal pulses.  Murmur heard.  Early systolic murmur is present with a grade of 2/6 at the upper right sternal border. Pulses:      Popliteal pulses are 2+ on the right side and 2+ on the left side.       Dorsalis pedis pulses are 2+ on the right side and 2+ on the left side.       Posterior tibial pulses are 2+ on the right side and 2+ on the left side.  Bilateral acrocyanosis  Pulmonary/Chest: Effort normal and breath sounds normal. No accessory muscle usage. No respiratory distress.  Abdominal: Soft. Bowel sounds are normal.  Musculoskeletal: Normal range of motion.  Neurological: He is alert and oriented to person,  place, and time.  Skin: Skin is warm and dry.  Vitals reviewed.  Radiology: No results found.  Laboratory examination:   CMP Latest Ref Rng & Units 06/26/2019  Glucose 70 - 99 mg/dL 143(H)  BUN 8 - 23 mg/dL 13  Creatinine 0.61 - 1.24 mg/dL 0.96  Sodium 135 - 145 mmol/L 139  Potassium 3.5 - 5.1 mmol/L 3.9  Chloride 98 - 111 mmol/L 102  CO2 22 - 32 mmol/L 27  Calcium 8.9 - 10.3 mg/dL 10.2  Total Protein 6.5 - 8.1 g/dL 7.5  Total Bilirubin 0.3 - 1.2 mg/dL 1.1  Alkaline Phos 38 - 126 U/L 61  AST 15 - 41 U/L 23  ALT 0 - 44 U/L 20   CBC Latest Ref Rng & Units 06/26/2019  WBC 4.0 - 10.5 K/uL 10.0  Hemoglobin 13.0 - 17.0 g/dL  17.9(H)  Hematocrit 39.0 - 52.0 % 52.8(H)  Platelets 150 - 400 K/uL 189   Lipid Panel  No results found for: CHOL, TRIG, HDL, CHOLHDL, VLDL, LDLCALC, LDLDIRECT HEMOGLOBIN A1C No results found for: HGBA1C, MPG TSH No results for input(s): TSH in the last 8760 hours.  PRN Meds:. There are no discontinued medications. Current Meds  Medication Sig  . amLODipine (NORVASC) 5 MG tablet Take 1 tablet (5 mg total) by mouth daily.  Marland Kitchen levothyroxine (SYNTHROID) 88 MCG tablet Take 88 mcg by mouth daily before breakfast.  . LORazepam (ATIVAN) 0.5 MG tablet Take 2 tablets (1 mg total) by mouth every 8 (eight) hours as needed (dizziness).  . meclizine (ANTIVERT) 25 MG tablet Take 25 mg by mouth 3 (three) times daily as needed for dizziness.  . metoprolol tartrate (LOPRESSOR) 25 MG tablet Take 1 tablet (25 mg total) by mouth 2 (two) times daily.  Marland Kitchen olmesartan (BENICAR) 40 MG tablet Take 1 tablet (40 mg total) by mouth daily.  . rosuvastatin (CRESTOR) 20 MG tablet Take 20 mg by mouth daily.    Cardiac Studies:   Echocardiogram 07/12/2019: Left ventricle cavity is normal in size. Mild concentric hypertrophy of the left ventricle. Mildly depressed LV systolic function with EF 50%. Normal global wall motion. Normal diastolic filling pattern. Calculated EF 50%. Mild (Grade  I) mitral regurgitation. Inadequate TR jet to estimate pulmonary artery systolic pressure. Normal right atrial pressure.   Exercise tetrofosmin stress test  08/07/2019: Resting EKG normal sinus rhythm, IRBBB. Stress EKG negative for myocardial ischemia. Patient exercised on Bonny protocol for 5:00 minutes, achieving approximately 5.4 METs. Exercise was terminated due to fatigue/weakness. Myocardial perfusion imaging is normal. Left ventricular ejection fraction is 53% with normal wall motion. Low risk study. No previous exam available for comparison.  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 IRBBB. Abnormal EKG.  Recommendations:   I have discussed recently obtained exercise nuclear stress test that shows normal myocardial perfusion, low risk study. He has not had any symptoms of angina. I have discussed recent echocardiogram that shows low normal LVEF at 50%. Potentially related to hypertension. His blood pressure has markedly improved with addition of amlodipine and Metoprolol, will continue the same. I have recommended continued aggressive blood pressure and hyperlipidemia control. Plan of care was discussed with the patients daughter. Will plan to see him back in 3 months for follow up, but encouraged sooner follow up if needed.   Toniann Fail, MSN, APRN, FNP-C [facility] [place] Office: [phone] Fax: [phone]

## 2019-09-05 ENCOUNTER — Other Ambulatory Visit: Payer: Self-pay

## 2019-11-06 ENCOUNTER — Ambulatory Visit: Payer: Medicare Other | Attending: Internal Medicine

## 2019-11-06 DIAGNOSIS — Z23 Encounter for immunization: Secondary | ICD-10-CM

## 2019-11-06 NOTE — Progress Notes (Signed)
   Covid-19 Vaccination Clinic  Name:  Edward Conway    MRN: [id number] DOB: 08-Jun-1941  11/06/2019  Edward Conway was observed post Covid-19 immunization for 15 minutes without incidence. He was provided with Vaccine Information Sheet and instruction to access the V-Safe system.   Edward Conway was instructed to call 911 with any severe reactions post vaccine: Marland Kitchen Difficulty breathing  . Swelling of your face and throat  . A fast heartbeat  . A bad rash all over your body  . Dizziness and weakness    Immunizations Administered    Name Date Dose VIS Date Route   Pfizer COVID-19 Vaccine 11/06/2019  2:58 PM 0.3 mL 09/22/2019 Intramuscular   Manufacturer: [employer], [employer]   Lot: [id number]   NDC: [address]-4

## 2019-11-16 ENCOUNTER — Other Ambulatory Visit: Payer: Self-pay

## 2019-11-16 ENCOUNTER — Encounter: Payer: Self-pay | Admitting: Cardiology

## 2019-11-16 ENCOUNTER — Ambulatory Visit (INDEPENDENT_AMBULATORY_CARE_PROVIDER_SITE_OTHER): Payer: Medicare Other | Admitting: Cardiology

## 2019-11-16 VITALS — BP 147/98 | HR 76 | Temp 97.7°F | Resp 16 | Ht 67.0 in | Wt 171.7 lb

## 2019-11-16 DIAGNOSIS — E7800 Pure hypercholesterolemia, unspecified: Secondary | ICD-10-CM | POA: Diagnosis not present

## 2019-11-16 DIAGNOSIS — R9431 Abnormal electrocardiogram [ECG] [EKG]: Secondary | ICD-10-CM

## 2019-11-16 DIAGNOSIS — I10 Essential (primary) hypertension: Secondary | ICD-10-CM

## 2019-11-16 MED ORDER — AMLODIPINE BESYLATE 5 MG PO TABS: 5.0000 mg | ORAL_TABLET | Freq: Every day | ORAL | 2 refills | Status: DC

## 2019-11-16 MED ORDER — METOPROLOL SUCCINATE ER 50 MG PO TB24: 50.0000 mg | ORAL_TABLET | Freq: Every day | ORAL | 2 refills | Status: DC

## 2019-11-16 MED ORDER — METOPROLOL TARTRATE 25 MG PO TABS: 25.0000 mg | ORAL_TABLET | Freq: Two times a day (BID) | ORAL | 2 refills | Status: DC

## 2019-11-16 MED ORDER — ROSUVASTATIN CALCIUM 20 MG PO TABS: 20.0000 mg | ORAL_TABLET | Freq: Every day | ORAL | 1 refills | Status: DC

## 2019-11-16 NOTE — Progress Notes (Signed)
Primary Physician:  Merri Brunette, MD   Patient ID: Edward Conway, male    DOB: Mar 15, 1941, 79 y.o.   MRN: [id number]  Subjective:    Chief Complaint  Patient presents with  . Hypertension    HPI: Edward Conway  is a 79 y.o. male  with hypertension, hyperlipidemia, hypothyroidism, recently evaluated by [place] for abnormal EKG. He underwent lexiscan nuclear stress testing in October 2020 that was considered low risk study. Echocardiogram in Sept 2020 that showed mildly depressed LVEF. Since being on medical therapy, he has been feeling well. He now presents for 3 month follow up.  He has been feeling well since he was last seen by [place]. Tolerating medications well. Admits to only taking 2 of his medications. No dizziness. He has not been checking his blood pressure regularly.  He is very active with managing his 6 acres at home, denies any chest pain, dyspnea on exertion, claudication, syncope, palpitations, or symptoms suggestive of TIA.   No former tobacco use. Occasional alcohol use. No drug use.  Reports older brother died in his 18's of MI. Father also had questionable heart disease.   Past Medical History:  Diagnosis Date  . Hypertension     Past Surgical History:  Procedure Laterality Date  . THYROID SURGERY      Social History   Socioeconomic History  . Marital status: Widowed    Spouse name: Not on file  . Number of children: 2  . Years of education: Not on file  . Highest education level: Not on file  Occupational History  . Not on file  Tobacco Use  . Smoking status: Never Smoker  . Smokeless tobacco: Never Used  Substance and Sexual Activity  . Alcohol use: Yes    Comment: occ  . Drug use: No  . Sexual activity: Not on file  Other Topics Concern  . Not on file  Social History Narrative  . Not on file   Social Determinants of Health   Financial Resource Strain:   . Difficulty of Paying Living Expenses: Not on file  Food Insecurity:   .  Worried About Programme researcher, broadcasting/film/video in the Last Year: Not on file  . Ran Out of Food in the Last Year: Not on file  Transportation Needs:   . Lack of Transportation (Medical): Not on file  . Lack of Transportation (Non-Medical): Not on file  Physical Activity:   . Days of Exercise per Week: Not on file  . Minutes of Exercise per Session: Not on file  Stress:   . Feeling of Stress : Not on file  Social Connections:   . Frequency of Communication with Friends and Family: Not on file  . Frequency of Social Gatherings with Friends and Family: Not on file  . Attends Religious Services: Not on file  . Active Member of Clubs or Organizations: Not on file  . Attends Banker Meetings: Not on file  . Marital Status: Not on file  Intimate Partner Violence:   . Fear of Current or Ex-Partner: Not on file  . Emotionally Abused: Not on file  . Physically Abused: Not on file  . Sexually Abused: Not on file    Review of Systems  Constitution: Negative for decreased appetite, malaise/fatigue, weight gain and weight loss.  Eyes: Negative for visual disturbance.  Cardiovascular: Negative for chest pain, claudication, dyspnea on exertion, leg swelling, orthopnea, palpitations and syncope.  Respiratory: Negative for hemoptysis and wheezing.  Endocrine: Negative for cold intolerance and heat intolerance.  Hematologic/Lymphatic: Does not bruise/bleed easily.  Skin: Negative for nail changes.  Musculoskeletal: Negative for muscle weakness and myalgias.  Gastrointestinal: Negative for abdominal pain, change in bowel habit, nausea and vomiting.  Neurological: Negative for difficulty with concentration, dizziness, focal weakness and headaches.  Psychiatric/Behavioral: Negative for altered mental status and suicidal ideas.  All other systems reviewed and are negative.     Objective:  Blood pressure (!) 147/98, pulse 76, temperature 97.7 F (36.5 C), temperature source Temporal, resp. rate 16,  height 5\' 7"  (1.702 m), weight 171 lb 11.2 oz (77.9 kg), SpO2 94 %. Body mass index is 26.89 kg/m.     Physical Exam  Constitutional: He is oriented to person, place, and time. Vital signs are normal. He appears well-developed and well-nourished.  HENT:  Head: Normocephalic and atraumatic.  Cardiovascular: Normal rate, regular rhythm and intact distal pulses.  Murmur heard.  Early systolic murmur is present with a grade of 2/6 at the upper right sternal border. Pulses:      Popliteal pulses are 2+ on the right side and 2+ on the left side.       Dorsalis pedis pulses are 2+ on the right side and 2+ on the left side.       Posterior tibial pulses are 2+ on the right side and 2+ on the left side.  Bilateral acrocyanosis  Pulmonary/Chest: Effort normal and breath sounds normal. No accessory muscle usage. No respiratory distress.  Abdominal: Soft. Bowel sounds are normal.  Musculoskeletal:        General: Normal range of motion.     Cervical back: Normal range of motion.  Neurological: He is alert and oriented to person, place, and time.  Skin: Skin is warm and dry.  Vitals reviewed.  Rkadiology: No results found.  Laboratory examination:   CMP Latest Ref Rng & Units 06/26/2019  Glucose 70 - 99 mg/dL 143(H)  BUN 8 - 23 mg/dL 13  Creatinine 0.61 - 1.24 mg/dL 0.96  Sodium 135 - 145 mmol/L 139  Potassium 3.5 - 5.1 mmol/L 3.9  Chloride 98 - 111 mmol/L 102  CO2 22 - 32 mmol/L 27  Calcium 8.9 - 10.3 mg/dL 10.2  Total Protein 6.5 - 8.1 g/dL 7.5  Total Bilirubin 0.3 - 1.2 mg/dL 1.1  Alkaline Phos 38 - 126 U/L 61  AST 15 - 41 U/L 23  ALT 0 - 44 U/L 20   CBC Latest Ref Rng & Units 06/26/2019  WBC 4.0 - 10.5 K/uL 10.0  Hemoglobin 13.0 - 17.0 g/dL 17.9(H)  Hematocrit 39.0 - 52.0 % 52.8(H)  Platelets 150 - 400 K/uL 189   Lipid Panel  No results found for: CHOL, TRIG, HDL, CHOLHDL, VLDL, LDLCALC, LDLDIRECT HEMOGLOBIN A1C No results found for: HGBA1C, MPG TSH No results for  input(s): TSH in the last 8760 hours.  PRN Meds:. Medications Discontinued During This Encounter  Medication Reason  . meclizine (ANTIVERT) 25 MG tablet Error  . amLODipine (NORVASC) 5 MG tablet Reorder  . metoprolol tartrate (LOPRESSOR) 25 MG tablet Discontinued by provider  . metoprolol tartrate (LOPRESSOR) 25 MG tablet Discontinued by provider  . rosuvastatin (CRESTOR) 20 MG tablet Duplicate  . rosuvastatin (CRESTOR) 20 MG tablet Duplicate   Current Meds  Medication Sig  . amLODipine (NORVASC) 5 MG tablet Take 1 tablet (5 mg total) by mouth daily.  Marland Kitchen levothyroxine (SYNTHROID) 88 MCG tablet Take 88 mcg by mouth daily before breakfast.  .  LORazepam (ATIVAN) 0.5 MG tablet Take 2 tablets (1 mg total) by mouth every 8 (eight) hours as needed (dizziness).  Marland Kitchen olmesartan (BENICAR) 40 MG tablet Take 1 tablet (40 mg total) by mouth daily.  . [DISCONTINUED] amLODipine (NORVASC) 5 MG tablet Take 1 tablet (5 mg total) by mouth daily.    Cardiac Studies:   Echocardiogram 07/12/2019: Left ventricle cavity is normal in size. Mild concentric hypertrophy of the left ventricle. Mildly depressed LV systolic function with EF 50%. Normal global wall motion. Normal diastolic filling pattern. Calculated EF 50%. Mild (Grade I) mitral regurgitation. Inadequate TR jet to estimate pulmonary artery systolic pressure. Normal right atrial pressure.   Exercise tetrofosmin stress test  08/07/2019: Resting EKG normal sinus rhythm, IRBBB. Stress EKG negative for myocardial ischemia. Patient exercised on Raji protocol for 5:00 minutes, achieving approximately 5.4 METs. Exercise was terminated due to fatigue/weakness. Myocardial perfusion imaging is normal. Left ventricular ejection fraction is 53% with normal wall motion. Low risk study. No previous exam available for comparison.  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 IRBBB. Abnormal EKG.  Recommendations:   Patient presents for follow-up for hypertension.  His blood pressure is uncontrolled, admits to only taking olmesartan and amlodipine.  His daughter states that he is having difficulty with short-term memory.  I explained indications for his blood pressure medications, I will change metoprolol tartrate to metoprolol succinate 50 mg daily for ease and also better blood pressure control.  Advised him to continue with amlodipine and olmesartan for hypertension.  He has not been taking his statin or Zetia.  I am unsure of his recent lab results.  He is scheduled to see his PCP next month.  I have refilled his Crestor, he will discuss if he needs to continue with Zetia at his follow-up with PCP.  Is not had any recurrent symptoms of angina.  No clinical evidence of heart failure.  Suspect mildly depressed LVEF is related to uncontrolled hypertension.  I would like to see him back in 4 weeks to follow-up on hypertension and make further recommendations at that time.    Toniann Fail, MSN, APRN, FNP-C [facility] Office: [phone] Fax: [phone]

## 2019-11-18 ENCOUNTER — Ambulatory Visit: Payer: Medicare Other

## 2019-11-27 ENCOUNTER — Ambulatory Visit: Payer: Medicare Other | Attending: Internal Medicine

## 2019-11-27 DIAGNOSIS — Z23 Encounter for immunization: Secondary | ICD-10-CM | POA: Insufficient documentation

## 2019-11-27 NOTE — Progress Notes (Signed)
   Covid-19 Vaccination Clinic  Name:  Edward Conway    MRN: [id number] DOB: [birth year]  11/27/2019  [name] was observed post Covid-19 immunization for 15 minutes without incidence. He was provided with Vaccine Information Sheet and instruction to access the V-Safe system.   [name] was instructed to call 911 with any severe reactions post vaccine: Marland Kitchen Difficulty breathing  . Swelling of your face and throat  . A fast heartbeat  . A bad rash all over your body  . Dizziness and weakness    Immunizations Administered    Name Date Dose VIS Date Route   Pfizer COVID-19 Vaccine 11/27/2019 12:39 PM 0.3 mL 09/22/2019 Intramuscular   Manufacturer: [employer], [employer]   Lot: [id number]   NDC: [address]-3

## 2019-12-14 ENCOUNTER — Ambulatory Visit: Payer: Medicare Other | Admitting: Cardiology

## 2019-12-14 ENCOUNTER — Encounter: Payer: Self-pay | Admitting: Cardiology

## 2019-12-14 ENCOUNTER — Other Ambulatory Visit: Payer: Self-pay

## 2019-12-14 VITALS — BP 135/85 | HR 84 | Temp 98.5°F | Ht 67.0 in | Wt 171.0 lb

## 2019-12-14 DIAGNOSIS — E7800 Pure hypercholesterolemia, unspecified: Secondary | ICD-10-CM

## 2019-12-14 DIAGNOSIS — I10 Essential (primary) hypertension: Secondary | ICD-10-CM

## 2019-12-14 MED ORDER — AMLODIPINE BESYLATE 10 MG PO TABS: 10.0000 mg | ORAL_TABLET | Freq: Every day | ORAL | 2 refills | Status: DC

## 2019-12-14 NOTE — Progress Notes (Signed)
Primary Physician:  Merri Brunette, MD   Patient ID: Edward Conway, male    DOB: 24-Aug-1941, 79 y.o.   MRN: [id number]  Subjective:    Chief Complaint  Patient presents with  . Hypertension    HPI: Edward Conway  is a 79 y.o. male  with hypertension, hyperlipidemia, hypothyroidism, recently evaluated by [place] for abnormal EKG. He underwent lexiscan nuclear stress testing in October 2020 that was considered low risk study. Echocardiogram in Sept 2020 that showed mildly depressed LVEF. Since being on medical therapy, he has been feeling well.   Last seen a few weeks ago, was found to not be taking his blood pressure medications.  He is now not taking amlodipine, metoprolol, and olmesartan.  He is also now taking his Crestor and Zetia.  Tolerating medications well.  He is very active with managing his 6 acres at home, denies any chest pain, dyspnea on exertion, claudication, syncope, palpitations, or symptoms suggestive of TIA.   No former tobacco use. Occasional alcohol use. No drug use.  Reports older brother died in his 51's of MI. Father also had questionable heart disease.   Past Medical History:  Diagnosis Date  . Hypertension     Past Surgical History:  Procedure Laterality Date  . THYROID SURGERY      Social History   Socioeconomic History  . Marital status: Widowed    Spouse name: Not on file  . Number of children: 2  . Years of education: Not on file  . Highest education level: Not on file  Occupational History  . Not on file  Tobacco Use  . Smoking status: Never Smoker  . Smokeless tobacco: Never Used  Substance and Sexual Activity  . Alcohol use: Yes    Comment: occ  . Drug use: No  . Sexual activity: Not on file  Other Topics Concern  . Not on file  Social History Narrative  . Not on file   Social Determinants of Health   Financial Resource Strain:   . Difficulty of Paying Living Expenses: Not on file  Food Insecurity:   . Worried About  Programme researcher, broadcasting/film/video in the Last Year: Not on file  . Ran Out of Food in the Last Year: Not on file  Transportation Needs:   . Lack of Transportation (Medical): Not on file  . Lack of Transportation (Non-Medical): Not on file  Physical Activity:   . Days of Exercise per Week: Not on file  . Minutes of Exercise per Session: Not on file  Stress:   . Feeling of Stress : Not on file  Social Connections:   . Frequency of Communication with Friends and Family: Not on file  . Frequency of Social Gatherings with Friends and Family: Not on file  . Attends Religious Services: Not on file  . Active Member of Clubs or Organizations: Not on file  . Attends Banker Meetings: Not on file  . Marital Status: Not on file  Intimate Partner Violence:   . Fear of Current or Ex-Partner: Not on file  . Emotionally Abused: Not on file  . Physically Abused: Not on file  . Sexually Abused: Not on file    Review of Systems  Constitution: Negative for decreased appetite, malaise/fatigue, weight gain and weight loss.  Eyes: Negative for visual disturbance.  Cardiovascular: Negative for chest pain, claudication, dyspnea on exertion, leg swelling, orthopnea, palpitations and syncope.  Respiratory: Negative for hemoptysis and wheezing.   Endocrine:  Negative for cold intolerance and heat intolerance.  Hematologic/Lymphatic: Does not bruise/bleed easily.  Skin: Negative for nail changes.  Musculoskeletal: Negative for muscle weakness and myalgias.  Gastrointestinal: Negative for abdominal pain, change in bowel habit, nausea and vomiting.  Neurological: Negative for difficulty with concentration, dizziness, focal weakness and headaches.  Psychiatric/Behavioral: Negative for altered mental status and suicidal ideas.  All other systems reviewed and are negative.     Objective:  Blood pressure 137/90, pulse 84, temperature 98.5 F (36.9 C), height 5\' 7"  (1.702 m), weight 171 lb (77.6 kg), SpO2 95 %.  Body mass index is 26.78 kg/m.     Physical Exam  Constitutional: He is oriented to person, place, and time. Vital signs are normal. He appears well-developed and well-nourished.  HENT:  Head: Normocephalic and atraumatic.  Cardiovascular: Normal rate, regular rhythm and intact distal pulses.  Murmur heard.  Early systolic murmur is present with a grade of 2/6 at the upper right sternal border. Pulses:      Popliteal pulses are 2+ on the right side and 2+ on the left side.       Dorsalis pedis pulses are 2+ on the right side and 2+ on the left side.       Posterior tibial pulses are 2+ on the right side and 2+ on the left side.  Bilateral acrocyanosis  Pulmonary/Chest: Effort normal and breath sounds normal. No accessory muscle usage. No respiratory distress.  Abdominal: Soft. Bowel sounds are normal.  Musculoskeletal:        General: Normal range of motion.     Cervical back: Normal range of motion.  Neurological: He is alert and oriented to person, place, and time.  Skin: Skin is warm and dry.  Vitals reviewed.  Rkadiology: No results found.  Laboratory examination:   CMP Latest Ref Rng & Units 06/26/2019  Glucose 70 - 99 mg/dL 143(H)  BUN 8 - 23 mg/dL 13  Creatinine 0.61 - 1.24 mg/dL 0.96  Sodium 135 - 145 mmol/L 139  Potassium 3.5 - 5.1 mmol/L 3.9  Chloride 98 - 111 mmol/L 102  CO2 22 - 32 mmol/L 27  Calcium 8.9 - 10.3 mg/dL 10.2  Total Protein 6.5 - 8.1 g/dL 7.5  Total Bilirubin 0.3 - 1.2 mg/dL 1.1  Alkaline Phos 38 - 126 U/L 61  AST 15 - 41 U/L 23  ALT 0 - 44 U/L 20   CBC Latest Ref Rng & Units 06/26/2019  WBC 4.0 - 10.5 K/uL 10.0  Hemoglobin 13.0 - 17.0 g/dL 17.9(H)  Hematocrit 39.0 - 52.0 % 52.8(H)  Platelets 150 - 400 K/uL 189   Lipid Panel  No results found for: CHOL, TRIG, HDL, CHOLHDL, VLDL, LDLCALC, LDLDIRECT HEMOGLOBIN A1C No results found for: HGBA1C, MPG TSH No results for input(s): TSH in the last 8760 hours.  PRN Meds:. There are no  discontinued medications. Current Meds  Medication Sig  . amLODipine (NORVASC) 5 MG tablet Take 1 tablet (5 mg total) by mouth daily.  Marland Kitchen levothyroxine (SYNTHROID) 88 MCG tablet Take 88 mcg by mouth daily before breakfast.  . LORazepam (ATIVAN) 0.5 MG tablet Take 2 tablets (1 mg total) by mouth every 8 (eight) hours as needed (dizziness).  . metoprolol succinate (TOPROL-XL) 50 MG 24 hr tablet Take 1 tablet (50 mg total) by mouth daily. Take with or immediately following a meal.  . olmesartan (BENICAR) 40 MG tablet Take 1 tablet (40 mg total) by mouth daily.  . rosuvastatin (CRESTOR) 20 MG tablet  Take 20 mg by mouth daily.  Marland Kitchen ZETIA 10 MG tablet Take 10 mg by mouth daily.    Cardiac Studies:   Echocardiogram 07/12/2019: Left ventricle cavity is normal in size. Mild concentric hypertrophy of the left ventricle. Mildly depressed LV systolic function with EF 50%. Normal global wall motion. Normal diastolic filling pattern. Calculated EF 50%. Mild (Grade I) mitral regurgitation. Inadequate TR jet to estimate pulmonary artery systolic pressure. Normal right atrial pressure.   Exercise tetrofosmin stress test  08/07/2019: Resting EKG normal sinus rhythm, IRBBB. Stress EKG negative for myocardial ischemia. Patient exercised on Clancy protocol for 5:00 minutes, achieving approximately 5.4 METs. Exercise was terminated due to fatigue/weakness. Myocardial perfusion imaging is normal. Left ventricular ejection fraction is 53% with normal wall motion. Low risk study. No previous exam available for comparison.  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 IRBBB. Abnormal EKG.  Recommendations:   Patient is here for hypertension follow-up.  His blood pressure has significantly improved since being back on his medications, but continues to be slightly elevated.  I will further  increase his amlodipine 5 g daily.  He is overall feeling well.  No symptoms of angina or clinical evidence of heart failure.  Would recommend continued aggressive medical management.  He does have hyperlipidemia and at his last office visit, was found to not be taking his cholesterol medications.  He is now back taking his Crestor and Zetia.  He is to see his PCP in April, would recommend repeat lipid panel at that time for surveillance. He would be a good candidate for remote BP monitoring, will arrange for this.  I will plan to see him back in 6 weeks for follow-up on hypertension.    Toniann Fail, MSN, APRN, FNP-C [facility] [place] Office: [phone] Fax: [phone]

## 2019-12-19 ENCOUNTER — Telehealth: Payer: Self-pay | Admitting: Pharmacist

## 2019-12-19 NOTE — Telephone Encounter (Signed)
Pharmacist called to review BP reading. LVM for pt to call back.  

## 2020-01-12 ENCOUNTER — Other Ambulatory Visit: Payer: Self-pay | Admitting: Cardiology

## 2020-01-24 ENCOUNTER — Other Ambulatory Visit: Payer: Self-pay

## 2020-01-24 ENCOUNTER — Ambulatory Visit: Payer: Medicare Other | Admitting: Cardiology

## 2020-01-24 ENCOUNTER — Encounter: Payer: Self-pay | Admitting: Cardiology

## 2020-01-24 VITALS — BP 123/79 | HR 74 | Temp 97.6°F | Resp 15 | Ht 67.0 in | Wt 174.0 lb

## 2020-01-24 DIAGNOSIS — E782 Mixed hyperlipidemia: Secondary | ICD-10-CM

## 2020-01-24 DIAGNOSIS — I10 Essential (primary) hypertension: Secondary | ICD-10-CM

## 2020-01-24 DIAGNOSIS — E039 Hypothyroidism, unspecified: Secondary | ICD-10-CM

## 2020-01-24 NOTE — Progress Notes (Signed)
Edward Conway Date of Birth: 1940-10-14 MRN: [id number] Primary Care Provider:Pharr, Zollie Beckers, MD Former Cardiology Providers: Toniann Fail, MSN, APRN, FNP-C Primary Cardiologist: Tessa Lerner, DO  Date: 01/24/20 Last Office Visit: 12/14/2019  Chief Complaint  Patient presents with  . Follow-up    6 week  . Hypertension   HPI  Edward Conway is a 79 y.o.  male who presents to the office with a chief complaint of " hypertension follow-up." Patient's past medical history and cardiovascular risk factors include: Hypertension, hyperlipidemia, hypothyroidism, advanced age.  At the last office visit his antihypertensive medications were uptitrated and he was enrolled into physical care management for amatory blood pressure monitoring.  Since then patient has been checking his blood pressures regularly and the numbers have improved.  Patient was also instructed to continue his pharmacological therapy for mixed hyperlipidemia and to follow-up in 6 weeks to see if additional antihypertensive medications are needed.  Since last office visit patient states that his blood pressures are very well controlled.  He takes all his blood pressure medications in the morning.  Upon waking up in the morning he checks his blood pressure and his systolic blood pressures were originally ranging between 138-169 mmHg and diastolic blood pressures were between 98 and 114 mmHg.  Recently his systolic blood pressures have improved ranging between 124-136 mmHg and diastolic blood pressures between 85-95 mmHg.  Medications reconciled.  Patient states that he tries to consume a low-salt diet.  His morning blood pressures are higher than his evening readings.  We went over questions regarding sleep apnea.  Patient is tolerating his statin medication without any side effects or intolerances.  We discussed rechecking his lipid profile.  He states that he has a primary care appointment coming up in 2 weeks for a  physical during which she will have his lipid profile rechecked.  Denies prior history of coronary artery disease, myocardial infarction, congestive heart failure, deep venous thrombosis, pulmonary embolism, stroke, transient ischemic attack.  FUNCTIONAL STATUS: very active with managing his 6 acres at home.  ALLERGIES: No Known Allergies   MEDICATION LIST PRIOR TO VISIT: Current Outpatient Medications on File Prior to Visit  Medication Sig Dispense Refill  . amLODipine (NORVASC) 10 MG tablet Take 1 tablet (10 mg total) by mouth daily. 30 tablet 2  . ezetimibe (ZETIA) 10 MG tablet TAKE 1 TABLET BY MOUTH ONCE A DAY 30 tablet 0  . levothyroxine (SYNTHROID) 88 MCG tablet Take 88 mcg by mouth daily before breakfast.    . metoprolol succinate (TOPROL-XL) 50 MG 24 hr tablet Take 1 tablet (50 mg total) by mouth daily. Take with or immediately following a meal. 30 tablet 2  . olmesartan (BENICAR) 40 MG tablet Take 1 tablet (40 mg total) by mouth daily. 30 tablet 0  . rosuvastatin (CRESTOR) 20 MG tablet Take 20 mg by mouth daily.    Marland Kitchen LORazepam (ATIVAN) 0.5 MG tablet Take 2 tablets (1 mg total) by mouth every 8 (eight) hours as needed (dizziness). 10 tablet 0   No current facility-administered medications on file prior to visit.    PAST MEDICAL HISTORY: Past Medical History:  Diagnosis Date  . Hyperlipidemia   . Hypertension     PAST SURGICAL HISTORY: Past Surgical History:  Procedure Laterality Date  . THYROID SURGERY      FAMILY HISTORY: The patient's family history includes Colon cancer in his mother; Healthy in his father; Hypertension in his brother.   SOCIAL HISTORY:  The  patient  reports that he has never smoked. He has never used smokeless tobacco. He reports current alcohol use of about 2.0 standard drinks of alcohol per week. He reports that he does not use drugs.  Review of Systems  Constitution: Negative for chills and fever.  HENT: Negative for ear discharge, ear pain  and nosebleeds.   Eyes: Negative for blurred vision and discharge.  Cardiovascular: Negative for chest pain, claudication, dyspnea on exertion, leg swelling, near-syncope, orthopnea, palpitations, paroxysmal nocturnal dyspnea and syncope.  Respiratory: Positive for snoring. Negative for cough and shortness of breath.   Endocrine: Negative for polydipsia, polyphagia and polyuria.  Hematologic/Lymphatic: Negative for bleeding problem.  Skin: Negative for flushing and nail changes.  Musculoskeletal: Negative for muscle cramps, muscle weakness and myalgias.  Gastrointestinal: Negative for abdominal pain, dysphagia, hematemesis, hematochezia, melena, nausea and vomiting.  Neurological: Negative for dizziness, focal weakness and light-headedness.    PHYSICAL EXAM: Vitals with BMI 01/24/2020 12/14/2019 12/14/2019  Height 5\' 7"  - 5\' 7"   Weight 174 lbs - 171 lbs  BMI 27.25 - 26.78  Systolic 123 135  Diastolic 79 85 90  Pulse 74 - 84    CONSTITUTIONAL: Well-developed and well-nourished. No acute distress.  SKIN: Skin is warm and dry. No rash noted. No cyanosis. No pallor. No jaundice HEAD: Normocephalic and atraumatic.  EYES: No scleral icterus MOUTH/THROAT: Moist oral membranes.  NECK: No JVD present. No thyromegaly noted. No carotid bruits  LYMPHATIC: No visible cervical adenopathy.  CHEST Normal respiratory effort. No intercostal retractions  LUNGS: Clear to auscultation bilaterally.  No stridor. No wheezes. No rales.  CARDIOVASCULAR: Regular rate rhythm, positive S1-S2, soft systolic ejection murmur heard, gallops or rubs. ABDOMINAL: Nonobese, soft, nontender, nondistended, positive bowel sounds in all 4 quadrants no apparent ascites.  EXTREMITIES: No peripheral edema  HEMATOLOGIC: No significant bruising NEUROLOGIC: Oriented to person, place, and time. Nonfocal. Normal muscle tone.  PSYCHIATRIC: Normal mood and affect. Normal behavior. 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 IRBBB. Abnormal EKG. 01/14/2020: Normal sinus rhythm, 72 bpm, left axis deviation, left anterior fascicular block, old inferior infarct, poor R wave progression, without underlying injury pattern.  No significant change compared to prior EKG.  Echocardiogram: 07/12/2019: LVEF 50%, mild concentric LVH, mild MR, no other significant valvular abnormalities.  Stress Testing:  Exercise tetrofosmin stress test 08/07/2019: Resting EKG normal sinus rhythm, IRBBB. Stress EKG negative for myocardial ischemia. Patient exercised on Isaak protocol for 5:00 minutes, achieving approximately 5.4 METs. Exercise was terminated due to fatigue/weakness. Myocardial perfusion imaging is normal. Left ventricular ejection fraction is 53% with normal wall motion. Low risk study.  Heart Catheterization: None  LABORATORY DATA: CBC Latest Ref Rng & Units 06/26/2019  WBC 4.0 - 10.5 K/uL 10.0  Hemoglobin 13.0 - 17.0 g/dL 17.9(H)  Hematocrit 39.0 - 52.0 % 52.8(H)  Platelets 150 - 400 K/uL 189    CMP Latest Ref Rng & Units 06/26/2019  Glucose 70 - 99 mg/dL 06/28/2019)  BUN 8 - 23 mg/dL 13  Creatinine 06/28/2019 - 017(P mg/dL 1.02  Sodium 5.85 - 2.77 mmol/L 139  Potassium 3.5 - 5.1 mmol/L 3.9  Chloride 98 - 111 mmol/L 102  CO2 22 - 32 mmol/L 27  Calcium 8.9 - 10.3 mg/dL 824  Total Protein 6.5 - 8.1 g/dL 7.5  Total Bilirubin 0.3 - 1.2 mg/dL 1.1  Alkaline Phos 38 - 126 U/L 61  AST 15 - 41  U/L 23  ALT 0 - 44 U/L 20    Lipid Panel  No results found for: CHOL, TRIG, HDL, CHOLHDL, VLDL, LDLCALC, LDLDIRECT, LABVLDL  No results found for: HGBA1C No components found for: NTPROBNP No results found for: TSH  Cardiac Panel (last 3 results) No results for input(s): CKTOTAL, CKMB, TROPONINIHS, RELINDX in the last 72 hours.  FINAL MEDICATION LIST END OF ENCOUNTER: No orders of the defined types were placed in this encounter.   There are no  discontinued medications.   Current Outpatient Medications:  .  amLODipine (NORVASC) 10 MG tablet, Take 1 tablet (10 mg total) by mouth daily., Disp: 30 tablet, Rfl: 2 .  ezetimibe (ZETIA) 10 MG tablet, TAKE 1 TABLET BY MOUTH ONCE A DAY, Disp: 30 tablet, Rfl: 0 .  levothyroxine (SYNTHROID) 88 MCG tablet, Take 88 mcg by mouth daily before breakfast., Disp: , Rfl:  .  metoprolol succinate (TOPROL-XL) 50 MG 24 hr tablet, Take 1 tablet (50 mg total) by mouth daily. Take with or immediately following a meal., Disp: 30 tablet, Rfl: 2 .  olmesartan (BENICAR) 40 MG tablet, Take 1 tablet (40 mg total) by mouth daily., Disp: 30 tablet, Rfl: 0 .  rosuvastatin (CRESTOR) 20 MG tablet, Take 20 mg by mouth daily., Disp: , Rfl:  .  LORazepam (ATIVAN) 0.5 MG tablet, Take 2 tablets (1 mg total) by mouth every 8 (eight) hours as needed (dizziness)., Disp: 10 tablet, Rfl: 0  IMPRESSION:    ICD-10-CM   1. Essential hypertension  I10 EKG 12-Lead  2. Mixed hyperlipidemia  E78.2   3. Hypothyroidism, unspecified type  E03.9      RECOMMENDATIONS: TONG PIECZYNSKI is a 79 y.o. male whose past medical history and cardiovascular risk factors include: Hypertension, hyperlipidemia, hypothyroidism, advanced age.  Benign essential hypertension: . Office blood pressure is at goal.  . Medication reconciled.  Marland Kitchen He is asked to keep a log of both blood pressure and pulse so that medications can be titrated based on a trend as opposed to isolated blood pressure readings in the office. . If the blood pressure is consistently greater than 199mmHg patient is asked to call the office or his primary care provider's office for medication titration sooner than the next office visit.  . Low salt diet recommended. A diet that is rich in fruits, vegetables, legumes, and low-fat dairy products and low in snacks, sweets, and meats (such as the Dietary Approaches to Stop Hypertension [DASH] diet).  . Continue physical care management for  ambulatory blood pressure monitoring. . Patient is recommended to take amlodipine and Toprol-XL in the morning and olmesartan at night to possibly improve his morning blood pressures. . Given the fact that his blood pressures are higher in the morning and patient also endorses snoring recommend possibly discussing sleep study with his primary care physician.  Mixed hyperlipidemia: . Continue statin therapy and Zetia.   . Follow lipids.  Recommended repeat lipid profile.  Patient would like to have this done as part of his yearly physical in 2 weeks when he sees his primary. . Patient denies myalgia or other side effects.  Orders Placed This Encounter  Procedures  . EKG 12-Lead   --Continue cardiac medications as reconciled in final medication list. --Return in about 6 months (around 07/25/2020) for HTN. Or sooner if needed. --Continue follow-up with your primary care physician regarding the management of your other chronic comorbid conditions.  Patient's questions and concerns were addressed to his satisfaction. He voices  understanding of the instructions provided during this encounter.   During this visit I reviewed and updated: Tobacco history  allergies medication reconciliation  medical history  surgical history  family history  social history.  This note was created using a voice recognition software as a result there may be grammatical errors inadvertently enclosed that do not reflect the nature of this encounter. Every attempt is made to correct such errors.  Tessa Lerner, [place], [facility]  Pager: [phone] Office: [phone]

## 2020-01-24 NOTE — Patient Instructions (Signed)
Please remember to bring in your medication bottles in at the next visit.   New Medications that were added at today's visit:  None  Medications that were discontinued at today's visit: None  Consider taking amlodipine and Toprol XL in the morning.  And olmesartan in the evening.  At your upcoming appointment with primary care please have a fasting lipid profile done to reevaluate your cholesterol.  And discuss possibly being referred to sleep medicine for sleep study as your morning blood pressures are higher than goal.   Recommend follow up with your PCP as scheduled.

## 2020-02-18 NOTE — Progress Notes (Signed)
External Labs: Collected: January 31, 2020 Creatinine 1.1 mg/dL. Lipid profile: Total cholesterol 126, triglycerides 92, HDL 47, LDL 61, TSH: 0.7   At the last office visit (01/24/2020) patient was recommended to have a lipid profile done.  He chose to get it done at his yearly physical.  The labs that were forwarded to the office were reviewed summarized above.

## 2020-02-24 ENCOUNTER — Other Ambulatory Visit: Payer: Self-pay | Admitting: Cardiology

## 2020-02-28 ENCOUNTER — Other Ambulatory Visit: Payer: Self-pay

## 2020-02-28 ENCOUNTER — Telehealth: Payer: Self-pay

## 2020-02-28 ENCOUNTER — Ambulatory Visit (INDEPENDENT_AMBULATORY_CARE_PROVIDER_SITE_OTHER): Payer: Medicare Other | Admitting: Neurology

## 2020-02-28 ENCOUNTER — Encounter: Payer: Self-pay | Admitting: Neurology

## 2020-02-28 VITALS — BP 127/87 | HR 70 | Ht 67.0 in | Wt 172.3 lb

## 2020-02-28 DIAGNOSIS — R0683 Snoring: Secondary | ICD-10-CM | POA: Diagnosis not present

## 2020-02-28 DIAGNOSIS — G4750 Parasomnia, unspecified: Secondary | ICD-10-CM

## 2020-02-28 DIAGNOSIS — G4719 Other hypersomnia: Secondary | ICD-10-CM

## 2020-02-28 DIAGNOSIS — R351 Nocturia: Secondary | ICD-10-CM

## 2020-02-28 DIAGNOSIS — R413 Other amnesia: Secondary | ICD-10-CM

## 2020-02-28 DIAGNOSIS — E663 Overweight: Secondary | ICD-10-CM

## 2020-02-28 NOTE — Telephone Encounter (Signed)
Patient is having swelling in left ankle could this be related to the increase of the Amoldipine to 10 mg?

## 2020-02-28 NOTE — Progress Notes (Addendum)
Subjective:    Patient ID: Edward Conway is a 79 y.o. male.  HPI     Edward Age, Edward Conway, Edward Conway Edward Conway [address], [address] [place], Edward [place] [address]  Dear Dr. Shelia Media,  I saw your patient, Edward Conway, upon your kind request in my sleep clinic today for initial consultation of his sleep disorder, in particular, concern for underlying obstructive sleep apnea.  The patient is accompanied by his daughter, Edward Conway, today.  As you know, Edward Conway is a 79 year old right-handed gentleman with an underlying medical history of hypertension, hyperlipidemia, hypothyroidism and overweight state, who reports snoring and excessive daytime somnolence.  He has been more forgetful.  I reviewed your office note from 02/07/2020.  His Epworth sleepiness score is 9 out of 24, which may be an underestimation of his daytime somnolence as he does take MRI every day, sometimes 2+ hours.  He is widowed and lives alone, his daughter sees him frequently.  He has a son in [place].  The patient has had some forgetfulness and this includes forgetting medications, forgetting dates or events.  He drives, he has not had any trouble driving.  He is retired as a Forensic psychologist for some gentle.  He is a non-smoker and drinks alcohol occasionally, drinks caffeine in the form of soda, about 2 servings per day, has been in the process of reducing his soda intake per daughter.  She has also encouraged more water intake, he averages about 2 8 ounce servings of water per day by his estimate.  Bedtime is around 11 and rise time between 7 and 8.  He has significant nocturia about 3-4 times per average night, denies recurrent morning headaches, denies a family history of OSA and has not had a tonsillectomy.  He had a recent visit with his cardiologist and was encouraged to consider sleep apnea evaluation.  He had a sleep study some 5 or 6 years ago and CPAP therapy was not recommended at the time.   He has had some blood pressure fluctuations.  His amlodipine was increased in the recent past to 10 mg. In the recent 6 to 12 months he has been noted to have parasomnias particularly yelling out in his sleep.  This has been witnessed by his granddaughter. His Past Medical History Is Significant For: Past Medical History:  Diagnosis Date  . Hyperlipidemia   . Hypertension     His Past Surgical History Is Significant For: Past Surgical History:  Procedure Laterality Date  . THYROID SURGERY      His Family History Is Significant For: Family History  Problem Relation Conway of Onset  . Colon cancer Mother   . Healthy Father   . Hypertension Brother     His Social History Is Significant For: Social History   Socioeconomic History  . Marital status: Widowed    Spouse name: Not on file  . Number of children: 2  . Years of education: Not on file  . Highest education level: Not on file  Occupational History  . Not on file  Tobacco Use  . Smoking status: Never Smoker  . Smokeless tobacco: Never Used  Substance and Sexual Activity  . Alcohol use: Yes    Alcohol/week: 2.0 standard drinks    Types: 2 Cans of beer per week    Comment: occ  . Drug use: No  . Sexual activity: Not on file  Other Topics Concern  . Not on file  Social History Narrative  .  Not on file   Social Determinants of Health   Financial Resource Strain:   . Difficulty of Paying Living Expenses:   Food Insecurity:   . Worried About Programme researcher, broadcasting/film/video in the Last Year:   . Barista in the Last Year:   Transportation Needs:   . Freight forwarder (Medical):   Marland Kitchen Lack of Transportation (Non-Medical):   Physical Activity:   . Days of Exercise per Week:   . Minutes of Exercise per Session:   Stress:   . Feeling of Stress :   Social Connections:   . Frequency of Communication with Friends and Family:   . Frequency of Social Gatherings with Friends and Family:   . Attends Religious Services:    . Active Member of Clubs or Organizations:   . Attends Banker Meetings:   Marland Kitchen Marital Status:     His Allergies Are:  No Known Allergies:   His Current Medications Are:  Outpatient Encounter Medications as of 02/28/2020  Medication Sig  . amLODipine (NORVASC) 10 MG tablet Take 1 tablet (10 mg total) by mouth daily.  Marland Kitchen ezetimibe (ZETIA) 10 MG tablet TAKE 1 TABLET BY MOUTH ONCE A DAY  . levothyroxine (SYNTHROID) 88 MCG tablet Take 88 mcg by mouth daily before breakfast.  . olmesartan (BENICAR) 40 MG tablet Take 1 tablet (40 mg total) by mouth daily.  . rosuvastatin (CRESTOR) 20 MG tablet TAKE 1 TABLET BY MOUTH DAILY.  . SYNTHROID 100 MCG tablet Take 100 mcg by mouth daily.  . metoprolol succinate (TOPROL-XL) 50 MG 24 hr tablet Take 1 tablet (50 mg total) by mouth daily. Take with or immediately following a meal.   No facility-administered encounter medications on file as of 02/28/2020.  :  Review of Systems:  Out of a complete 14 point review of systems, all are reviewed and negative with the exception of these symptoms as listed below: Review of Systems  Neurological:       Here for sleep consult. Prior sleep study about 5 years ago. PAP treatment was not reccommended at the time.  Epworth Sleepiness Scale 0= would never doze 1= slight chance of dozing 2= moderate chance of dozing 3= high chance of dozing  Sitting and reading:0 Watching TV:2 Sitting inactive in a public place (ex. Theater or meeting):1 As a passenger in a car for an hour without a break:2 Lying down to rest in the afternoon:3 Sitting and talking to someone:1 Sitting quietly after lunch (no alcohol):2 In a car, while stopped in traffic:0 Total:9     Objective:  Neurological Exam  Physical Exam Physical Examination:   Vitals:   02/28/20 0900  BP: 127/87  Pulse: 70    General Examination: The patient is a very pleasant 79 y.o. male in no acute distress. He appears well-developed and  well-nourished and well groomed.   HEENT: Normocephalic, atraumatic, pupils are equal, round and reactive to light, extraocular tracking is good without limitation to gaze excursion or nystagmus noted. Hearing is grossly intact. Face is symmetric with normal facial animation. Rosacea type changes noted in the face/nose area.  Speech is clear with no dysarthria noted. There is no hypophonia. There is no lip, neck/head, jaw or voice tremor. Neck is supple with full range of passive and active motion. There are no carotid bruits on auscultation. Oropharynx exam reveals: mild mouth dryness, adequate dental hygiene and mild airway crowding, due to redundant soft palate. Mallampati is class II. Tongue protrudes  centrally and palate elevates symmetrically. Tonsils are small (?absent). Neck size is 16.25 inches. He has a Mild overbite.   Chest: Clear to auscultation without wheezing, rhonchi or crackles noted.  Heart: S1+S2+0, regular and normal without murmurs, rubs or gallops noted.   Abdomen: Soft, non-tender and non-distended with normal bowel sounds appreciated on auscultation.  Extremities: There is 1+ pitting edema in the distal L lower extremity, mostly around and above ankle.    Skin: Warm and dry without trophic changes noted.   Musculoskeletal: exam reveals no obvious joint deformities, tenderness or joint swelling or erythema.   Neurologically:  Mental status: The patient is awake, alert and oriented in all 4 spheres. His immediate and remote memory, attention, language skills and fund of knowledge are fairly appropriate. Daughter provides details of the history. There is no evidence of aphasia, agnosia, apraxia or anomia. Speech is clear with normal prosody and enunciation. Thought process is linear. Mood is normal and affect is normal.  Cranial nerves II - XII are as described above under HEENT exam.  Motor exam: Normal bulk, strength and tone is noted. There is no tremor. Fine motor skills  and coordination: grossly intact.  Cerebellar testing: No dysmetria or intention tremor. There is no truncal or gait ataxia.  Sensory exam: intact to light touch in the upper and lower extremities.  Gait, station and balance: He stands easily. No veering to one side is noted. No leaning to one side is noted. Posture is Conway-appropriate and stance is narrow based. Gait shows normal stride length and normal pace. No problems turning are noted.   Assessment and Plan:  In summary, Edward Conway is a very pleasant 79 y.o.-year old male with an underlying medical history of hypertension, hyperlipidemia, hypothyroidism and overweight state, whose history and physical exam concerning for obstructive sleep apnea (OSA). I had a long chat with the patient and Joni Reining about my findings and the diagnosis of OSA, its prognosis and treatment options. We talked about medical treatments, surgical interventions and non-pharmacological approaches. I explained in particular the risks and ramifications of untreated moderate to severe OSA, especially with respect to developing cardiovascular disease down the Road, including congestive heart failure, difficult to treat hypertension, cardiac arrhythmias, or stroke. Even type 2 diabetes has, in part, been linked to untreated OSA. Symptoms of untreated OSA include daytime sleepiness, memory problems, mood irritability and mood disorder such as depression and anxiety, lack of energy, as well as recurrent headaches, especially morning headaches. We talked about trying to maintain a healthy lifestyle in general, as well as the importance of weight control. We also talked about the importance of good sleep hygiene. I recommended the following at this time: sleep study.  We noticed some swelling in the left ankle and distal leg area, this appears to be new per patient and daughter.  Of note, he had a recent increase in his amlodipine from 5 mg to 10 mg about 2 months ago.  It may  coincide with that, the daughter is encouraged to call the cardiologist office to discuss the swelling.  I explained the sleep test procedure to the patient and also outlined possible surgical and non-surgical treatment options of OSA, including the use of a custom-made dental device (which would require a referral to a specialist dentist or oral surgeon), upper airway surgical options (which would involve a referral to an ENT surgeon). I also explained the CPAP treatment option to the patient, who indicated that he would be willing to  try CPAP if the need arises. I explained the importance of being compliant with PAP treatment, not only for insurance purposes but primarily to improve His symptoms, and for the patient's long term health benefit, including to reduce His cardiovascular risks. I answered all their questions today and the patient and his daughter were in agreement. I plan to see him back after the sleep study is completed and encouraged him to call with any interim questions, concerns, problems or updates.   Thank you very much for allowing me to participate in the care of this nice patient. If I can be of any further assistance to you please do not hesitate to call me at [phone].  Sincerely,   Huston Foley, Edward Conway, Edward Conway

## 2020-02-28 NOTE — Patient Instructions (Addendum)
Thank you for choosing [facility] for your sleep related care! It was nice to meet you today! I appreciate that you entrust me with your sleep related healthcare concerns. I hope, I was able to address at least some of your concerns today, and that I can help you feel reassured and also get better.    Here is what we discussed today and what we came up with as our plan for you:   Please talk to your cardiologist about your ankle swelling on the left side, you had a recent increase in your amlodipine and it may be from the amlodipine, definitely something to monitor.   Based on your symptoms and your exam I believe you are at risk for obstructive sleep apnea (aka OSA), and I think we should proceed with a sleep study to determine whether you do or do not have OSA and how severe it is. Even, if you have mild OSA, I may want you to consider treatment with CPAP, as treatment of even borderline or mild sleep apnea can result and improvement of symptoms such as sleep disruption, daytime sleepiness, nighttime bathroom breaks, restless leg symptoms, improvement of headache syndromes, even improved mood disorder.   Please remember, the long-term risks and ramifications of untreated moderate to severe obstructive sleep apnea are: increased Cardiovascular disease, including congestive heart failure, stroke, difficult to control hypertension, treatment resistant obesity, arrhythmias, especially irregular heartbeat commonly known as A. Fib. (atrial fibrillation); even type 2 diabetes has been linked to untreated OSA.   Sleep apnea can cause disruption of sleep and sleep deprivation in most cases, which, in turn, can cause recurrent headaches, problems with memory, mood, concentration, focus, and vigilance. Most people with untreated sleep apnea report excessive daytime sleepiness, which can affect their ability to drive. Please do not drive if you feel sleepy. Patients with sleep apnea developed  difficulty initiating and maintaining sleep (aka insomnia).   Having sleep apnea may increase your risk for other sleep disorders, including involuntary behaviors sleep such as sleep terrors, sleep talking, sleepwalking.    Having sleep apnea can also increase your risk for restless leg syndrome and leg movements at night.   Please note that untreated obstructive sleep apnea may carry additional perioperative morbidity. Patients with significant obstructive sleep apnea (typically, in the moderate to severe degree) should receive, if possible, perioperative PAP (positive airway pressure) therapy and the surgeons and particularly the anesthesiologists should be informed of the diagnosis and the severity of the sleep disordered breathing.   I will likely see you back after your sleep study to go over the test results and where to go from there. We will call you after your sleep study to advise about the results (most likely, you will hear from [place], my nurse) and to set up an appointment at the time, as necessary.    Our sleep lab administrative assistant will call you to schedule your sleep study and give you further instructions, regarding the check in process for the sleep study, arrival time, what to bring, when you can expect to leave after the study, etc., and to answer any other logistical questions you may have. If you don't hear back from her by about 2 weeks from now, please feel free to call her direct line at [phone] or you can call our general clinic number, or email [place] through My Chart.

## 2020-02-29 NOTE — Telephone Encounter (Signed)
Yes, Amlodipine can cause swelling but usually symmetrical. If patient is willing we can d/c amlodipine and start hydrochlorothiazide. Let me know his thoughts?

## 2020-02-29 NOTE — Telephone Encounter (Signed)
Patient is willing to discontinue Amlodipine and try HCTZ.

## 2020-03-01 ENCOUNTER — Other Ambulatory Visit: Payer: Self-pay | Admitting: Cardiology

## 2020-03-01 DIAGNOSIS — I10 Essential (primary) hypertension: Secondary | ICD-10-CM

## 2020-03-01 MED ORDER — HYDROCHLOROTHIAZIDE 12.5 MG PO CAPS: 12.5000 mg | ORAL_CAPSULE | Freq: Every morning | ORAL | 0 refills | Status: DC

## 2020-03-01 NOTE — Telephone Encounter (Signed)
Script sent.  Labs in 1 week after starting hctz to check kidney and electrolytes at [facility].  Pls inform the patient.

## 2020-03-01 NOTE — Telephone Encounter (Signed)
Patient daughter informed. She verbalized understanding.

## 2020-03-15 ENCOUNTER — Other Ambulatory Visit: Payer: Self-pay | Admitting: Cardiology

## 2020-03-16 LAB — BASIC METABOLIC PANEL
BUN/Creatinine Ratio: 12 (ref 10–24)
BUN: 13 mg/dL (ref 8–27)
CO2: 25 mmol/L (ref 20–29)
Calcium: 9.9 mg/dL (ref 8.6–10.2)
Chloride: 100 mmol/L (ref 96–106)
Creatinine, Ser: 1.11 mg/dL (ref 0.76–1.27)
GFR calc Af Amer: 72 mL/min/{1.73_m2} (ref >59)
GFR calc non Af Amer: 62 mL/min/{1.73_m2} (ref >59)
Glucose: 97 mg/dL (ref 65–99)
Potassium: 4.1 mmol/L (ref 3.5–5.2)
Sodium: 139 mmol/L (ref 134–144)

## 2020-03-16 LAB — MAGNESIUM: Magnesium: 2.1 mg/dL (ref 1.6–2.3)

## 2020-03-18 ENCOUNTER — Other Ambulatory Visit: Payer: Self-pay | Admitting: Cardiology

## 2020-04-08 ENCOUNTER — Ambulatory Visit (INDEPENDENT_AMBULATORY_CARE_PROVIDER_SITE_OTHER): Payer: Medicare Other | Admitting: Neurology

## 2020-04-08 ENCOUNTER — Other Ambulatory Visit: Payer: Self-pay

## 2020-04-08 DIAGNOSIS — R413 Other amnesia: Secondary | ICD-10-CM

## 2020-04-08 DIAGNOSIS — R351 Nocturia: Secondary | ICD-10-CM

## 2020-04-08 DIAGNOSIS — G4733 Obstructive sleep apnea (adult) (pediatric): Secondary | ICD-10-CM

## 2020-04-08 DIAGNOSIS — G4750 Parasomnia, unspecified: Secondary | ICD-10-CM

## 2020-04-08 DIAGNOSIS — E663 Overweight: Secondary | ICD-10-CM

## 2020-04-08 DIAGNOSIS — G4719 Other hypersomnia: Secondary | ICD-10-CM

## 2020-04-08 DIAGNOSIS — R0683 Snoring: Secondary | ICD-10-CM

## 2020-04-08 DIAGNOSIS — G4731 Primary central sleep apnea: Secondary | ICD-10-CM

## 2020-04-17 ENCOUNTER — Telehealth: Payer: Self-pay

## 2020-04-17 NOTE — Progress Notes (Signed)
Patient referred by Dr. Renne Crigler, seen by me on 02/28/20, HST on 04/08/20:  Please call and notify the patient that the recent home sleep test showed severe sleep apnea. He had more central sleep apnea than obstructive. I recommend treatment with CPAP or BiPAP, which may be better for his central sleep apnea. If he is agreeable, I will request an overnight sleep study for proper titration and mask fitting. Please explain to patient. I have placed an order in the chart. Thanks.

## 2020-04-17 NOTE — Telephone Encounter (Signed)
-----   Message from Huston Foley, MD sent at 04/17/2020  7:59 AM EDT ----- Patient referred by Dr. Renne Crigler, seen by me on 02/28/20, HST on 04/08/20:  Please call and notify the patient that the recent home sleep test showed severe sleep apnea. He had more central sleep apnea than obstructive. I recommend treatment with CPAP or BiPAP, which may be better for his central sleep apnea. If he is agreeable, I will request an overnight sleep study for proper titration and mask fitting. Please explain to patient. I have placed an order in the chart. Thanks.

## 2020-04-17 NOTE — Procedures (Signed)
Patient Information     First Name: Edward Last Name: Conway ID: [id number]  Birth Date:  1941-04-21 Age: 79  Gender: Male  Referring Provider: Merri Brunette, MD BMI: 27.0 (W=172 lb, H=5' 7'')  Neck Circ.:  16 '' Epworth:  9/24   Sleep Study Information    Study Date: 04/08/20 S/H/A Version: [id number] / 4.1.1528 / 26  History:    79 year old man with a history of hypertension, hyperlipidemia, hypothyroidism and overweight state, who reports snoring and excessive daytime somnolence.  He has been more forgetful.  Summary & Diagnosis:    1. Primary central sleep apnea, 2. Obstructive sleep apnea  Recommendations:     This home sleep test demonstrates severe sleep disordered breathing with a primary central component. Central AHI was 46/hour, total AHI was 64.7/hour and O2 nadir of 82%. Given the patient's medical history and sleep related complaints, treatment with positive airway pressure (in the form of CPAP or BiPAP) is recommended. Given his central sleep apnea, I will recommend a full night titration study for proper treatment settings and modality, O2 monitoring and mask fitting. Based on the severity of the sleep disordered breathing an attended titration study is indicated. Please note that untreated obstructive sleep apnea may carry additional perioperative morbidity. Patients with significant obstructive sleep apnea should receive perioperative PAP therapy and the surgeons and particularly the anesthesiologist should be informed of the diagnosis and the severity of the sleep disordered breathing. The patient should be cautioned not to drive, work at heights, or operate dangerous or heavy equipment when tired or sleepy. Review and reiteration of good sleep hygiene measures should be pursued with any patient. Other causes of the patient's symptoms, including circadian rhythm disturbances, an underlying mood disorder, medication effect and/or an underlying medical problem cannot be ruled out  based on this test. Clinical correlation is recommended. The patient and his referring provider will be notified of the test results. The patient will be seen in follow up in sleep clinic at [facility].  I certify that I have reviewed the raw data recording prior to the issuance of this report in accordance with the standards of the American Academy of Sleep Medicine (AASM).  Huston Foley, MD, PhD [facility]) Diplomat, ABPN (Neurology and Sleep)             Sleep Summary  Oxygen Saturation Statistics   Start Study Time: End Study Time: Total Recording Time:  10:05:22 PM 6:32:11 AM 8 hrs, 26 min  Total Sleep Time % REM of Sleep Time:  5 hrs, 48 min 16.2    Mean: 94 Minimum: 82 Maximum: 99  Mean of Desaturations Nadirs (%):   91  Oxygen Desatur. %:   4-9 10-20 >20 Total  Events Number Total   189  12 94.0 6.0  0 0.0  201 100.0  Oxygen Saturation: <90 <=88 <85 <80 <70  Duration (minutes): Sleep % 2.8 0.8  1.7 0.2  0.5 0.1 0.0 0.0 0.0 0.0     Respiratory Indices      Total Events REM NREM All Night  pRDI:  284 pAHI:  283 ODI:  201 pAHIc: 171 % CSR: 62.8 42.4 42.4 35.7 22.3 67.6 67.3 47.2 41.0 65.0 64.7 46.0 39.1       Pulse Rate Statistics during Sleep (BPM)      Mean:  60 Minimum: N/A  Maximum: 111    Indices are calculated using technically valid sleep time of  4 hrs, 22 min. pRDI/pAHI  are calculated using oxi desaturations ? 3%   Body Position Statistics  Position Supine Prone Right Left Non-Supine  Sleep (min) 198.5 0.0 0.0 149.8 149.8  Sleep % 57.0 0.0 0.0 43.0 43.0  pRDI 59.4 N/A N/A 71.6 71.6  pAHI 59.0 N/A N/A 71.6 71.6  ODI 43.4 N/A N/A 49.1 49.1     Snoring Statistics Snoring Level (dB) >40 >50 >60 >70 >80 >Threshold (45)  Sleep (min) 116.2 23.7 4.8 0.0 0.0 47.2  Sleep % 33.4 6.8 1.4 0.0 0.0 13.5    Mean: 42 dB Sleep Stages Chart                                                                              pAHI=64.7                                                                                              Mild              Moderate                    Severe                                                 5              15                    30

## 2020-04-17 NOTE — Addendum Note (Signed)
Addended by: Huston Foley on: 04/17/2020 07:59 AM   Modules accepted: Orders

## 2020-04-17 NOTE — Telephone Encounter (Signed)
I called pt. No answer, left a message asking pt to call me back.   

## 2020-04-18 ENCOUNTER — Telehealth: Payer: Self-pay

## 2020-04-18 NOTE — Telephone Encounter (Signed)
Pt said he will call back. I did not get to give the results

## 2020-04-18 NOTE — Telephone Encounter (Signed)
I called pt. No answer, left a message asking pt to call me back.   

## 2020-04-23 NOTE — Telephone Encounter (Signed)
I called pt. I advised pt that Dr. Athar reviewed their sleep study results and found that severe osa was present and recommends that pt be treated with a cpap. Dr. Athar recommends that pt return for a repeat sleep study in order to properly titrate the cpap and ensure a good mask fit. Pt is agreeable to returning for a titration study. I advised pt that our sleep lab will file with pt's insurance and call pt to schedule the sleep study when we hear back from the pt's insurance regarding coverage of this sleep study. Pt verbalized understanding of results. Pt had no questions at this time but was encouraged to call back if questions arise.   

## 2020-05-16 ENCOUNTER — Ambulatory Visit (INDEPENDENT_AMBULATORY_CARE_PROVIDER_SITE_OTHER): Payer: Medicare Other | Admitting: Neurology

## 2020-05-16 ENCOUNTER — Other Ambulatory Visit: Payer: Self-pay

## 2020-05-16 DIAGNOSIS — G4733 Obstructive sleep apnea (adult) (pediatric): Secondary | ICD-10-CM

## 2020-05-16 DIAGNOSIS — G4731 Primary central sleep apnea: Secondary | ICD-10-CM | POA: Diagnosis not present

## 2020-05-16 DIAGNOSIS — G4750 Parasomnia, unspecified: Secondary | ICD-10-CM

## 2020-05-16 DIAGNOSIS — G4719 Other hypersomnia: Secondary | ICD-10-CM

## 2020-05-16 DIAGNOSIS — G4761 Periodic limb movement disorder: Secondary | ICD-10-CM

## 2020-05-16 DIAGNOSIS — R351 Nocturia: Secondary | ICD-10-CM

## 2020-05-16 DIAGNOSIS — E663 Overweight: Secondary | ICD-10-CM

## 2020-05-16 DIAGNOSIS — G4720 Circadian rhythm sleep disorder, unspecified type: Secondary | ICD-10-CM

## 2020-05-16 DIAGNOSIS — R413 Other amnesia: Secondary | ICD-10-CM

## 2020-05-31 NOTE — Procedures (Signed)
PATIENT'S NAME:  Edward Conway, [place] DOB:      11/05/1940      MR#:    [id number]     DATE OF RECORDING: 05/16/2020 REFERRING M.D.:  Merri Brunette, MD Study Performed:   CPAP  Titration HISTORY: 79 year old man with a history of hypertension, hyperlipidemia, hypothyroidism and overweight state, who presents for a full night titration study.  He had a home sleep test on 04/08/2020 which demonstrated severe sleep disordered breathing with a primary central component. Central AHI was 46/hour, total AHI was 64.7/hour and O2 nadir of 82%. The patient endorsed the Epworth Sleepiness Scale at 9 points. The patient's weight 172 pounds with a height of 67 (inches), resulting in a BMI of 27. kg/m2. The patient's neck circumference measured 16 inches.  CURRENT MEDICATIONS: Crestor, Benicar, Toprol-XL, Synthroid, Microzide, Zetia   PROCEDURE:  This is a multichannel digital polysomnogram utilizing the SomnoStar 11.2 system.  Electrodes and sensors were applied and monitored per AASM Specifications.   EEG, EOG, Chin and Limb EMG, were sampled at 200 Hz.  ECG, Snore and Nasal Pressure, Thermal Airflow, Respiratory Effort, CPAP Flow and Pressure, Oximetry was sampled at 50 Hz. Digital video and audio were recorded.      The patient was fitted with a medium F 20 full facemask.  He was started on CPAP therapy at a pressure of 6 cm.  He had notable central sleep apneas and after CPAP was increased to 12 cm he was switched to BiPAP therapy of 12/8 centimeters.  He had difficulty maintaining sleep.  He was tried on different pressure settings but he had difficulty tolerating BiPAP therapy and had ongoing central apneas.  He was therefore started on BiPAP ST with a backup rate of 12 and eventually achieved a little over 30 minutes of sleep on that setting with a residual AHI of 0/h, O2 nadir of 88%, with a small amount of nonsupine REM sleep achieved.  Lights Out was at 21:21 and Lights On at 04:25. Total recording time (TRT) was  425 minutes, with a total sleep time (TST) of 146.5 minutes. The patient's sleep latency to persistent sleep was markedly delayed at 147 minutes, REM latency was delayed at 140 minutes.  Sleep efficiency was markedly reduced at 34.5%.   SLEEP ARCHITECTURE: WASO (Wake after sleep onset) was 261 minutes with severe sleep fragmentation ntoed. There were 62.5 minutes in Stage N1, 59.5 minutes Stage N2, 2 minutes Stage N3 and 22.5 minutes in Stage REM.  The percentage of Stage N1 was 42.7%, which is markedly increased, Stage N2 was 40.6%, Stage N3 was 1.4% and Stage R (REM sleep) was 15.4%, which is mildly reduced. The arousals were noted as: 11 were spontaneous, 4 were associated with PLMs, 31 were associated with respiratory events.  RESPIRATORY ANALYSIS:  There was a total of 81 respiratory events: 0 obstructive apneas, 33 central apneas and 0 mixed apneas with a total of 33 apneas and an apnea index (AI) of 13.5 /hour. There were 48 hypopneas with a hypopnea index of 19.7/hour. The patient also had 0 respiratory event related arousals (RERAs).      The total APNEA/HYPOPNEA INDEX  (AHI) was 33.2 /hour and the total RESPIRATORY DISTURBANCE INDEX was 33.2 /hour  3 events occurred in REM sleep and 78 events in NREM. The REM AHI was 8 /hour versus a non-REM AHI of 37.7 /hour.  The patient spent 18 minutes of total sleep time in the supine position and 129 minutes in non-supine. The supine  AHI was 83.4, versus a non-supine AHI of 26.2.  OXYGEN SATURATION & C02:  The baseline 02 saturation was 96%, with the lowest being 82%. Time spent below 89% saturation equaled 12 minutes.  PERIODIC LIMB MOVEMENTS:  The patient had a total of 202 Periodic Limb Movements. The Periodic Limb Movement (PLM) index was 82.7/h and the PLM Arousal index was 1.6 /hour.  Audio and video analysis did not show any abnormal or unusual movements, behaviors, phonations or vocalizations. The patient took 2 bathroom breaks. The EKG was in  keeping with normal sinus rhythm (NSR).  Post-study, the patient indicated that sleep was worse than usual.   DIAGNOSIS 1. Primary central sleep apnea 2. Obstructive Sleep Apnea  3. Periodic Limb Movement Disorder  4. Dysfunctions associated with sleep stages or arousals from sleep  PLANS/RECOMMENDATIONS:  1. This was a difficult titration study.  The study was significantly limited due to difficulty maintaining sleep and difficulty tolerating positive airway pressure.  CPAP therapy was not adequate in reducing his central sleep disordered breathing and standard BiPAP therapy was also not effective enough.  He had reasonably good reduction of his central obstructive sleep disordered breathing with BiPAP ST of 16/11 centimeters with a backup rate of 12/min.  I will recommend home treatment with BiPAP ST at those settings via medium full facemask.  The patient will be reminded, that it may take up to 3 months to get fully used to using PAP with all planned sleep. The earlier full compliance is achieved, the better long term compliance tends to be. Please note that untreated obstructive sleep apnea may carry additional perioperative morbidity. Patients with significant obstructive sleep apnea should receive perioperative PAP therapy and the surgeons and particularly the anesthesiologist should be informed of the diagnosis and the severity of the sleep disordered breathing. 2. This study shows sleep fragmentation and abnormal sleep stage percentages; these are nonspecific findings and per se do not signify an intrinsic sleep disorder or a cause for the patient's sleep-related symptoms. Causes include (but are not limited to) the first night effect of the sleep study, circadian rhythm disturbances, medication effect or an underlying mood disorder or medical problem.  3. Severe PLMs (periodic limb movements of sleep) were noted during this study with without significant arousals; clinical correlation is  recommended.  4. The patient should be cautioned not to drive, work at heights, or operate dangerous or heavy equipment when tired or sleepy. Review and reiteration of good sleep hygiene measures should be pursued with any patient. 5. The patient will be seen in follow-up in the sleep clinic at [facility] for discussion of the test results, symptom and treatment compliance review, further management strategies, etc. The referring provider will be notified of the test results. I certify that I have reviewed the entire raw data recording prior to the issuance of this report in accordance with the Standards of Accreditation of the American Academy of Sleep Medicine (AASM)  Huston Foley, MD,PhD Diplomat, American Board of Neurology and Sleep Medicine ( Neurology and Sleep Medicine)

## 2020-05-31 NOTE — Progress Notes (Signed)
Patient referred by Dr. Renne Crigler, seen by me on 02/28/20, HST on 04/08/20, which showed mostly central sleep apnea, he then had a PAP titration study on 05/16/20.  Please call and inform patient that I have entered an order for treatment with positive airway pressure (PAP) treatment for his sleep apnea.  Unfortunately, he did not sleep very well.  He had trouble tolerating the BiPAP.  Nevertheless, he had reduction of his sleep apnea with BiPAP.  I would like for him to start treatment at home, the machine will be supplied from a local DME company.  I have placed an order in his chart and he should get contacted by the DME company shortly after we processed the order.  He will need a follow-up appointment in sleep clinic after 60 to 90 days after starting treatment.  He can see one of our nurse practitioners as well.   Please also explain to the patient that I will be looking out for compliance data, which can be downloaded from the machine (stored on an SD card, that is inserted in the machine) or via remote access through a modem, that is built into the machine. At the time of the followup appointment we will discuss sleep study results and how it is going with PAP treatment at home. Please advise patient to bring His machine at the time of the first FU visit, even though this is cumbersome. Bringing the machine for every visit after that will likely not be needed, but often helps for the first visit to troubleshoot if needed. Please re-enforce the importance of compliance with treatment and the need for [place] to monitor compliance data - often an insurance requirement and actually good feedback for the patient as far as how they are doing.  Also remind patient, that any interim PAP machine or mask issues should be first addressed with the DME company, as they can often help better with technical and mask fit issues. Please ask if patient has a preference regarding DME company.   Huston Foley, MD, PhD [facility])

## 2020-05-31 NOTE — Addendum Note (Signed)
Addended by: Huston Foley on: 05/31/2020 02:34 PM   Modules accepted: Orders

## 2020-06-03 ENCOUNTER — Telehealth: Payer: Self-pay

## 2020-06-03 NOTE — Telephone Encounter (Signed)
-----   Message from Huston Foley, MD sent at 05/31/2020  2:34 PM EDT ----- Patient referred by Dr. Renne Crigler, seen by me on 02/28/20, HST on 04/08/20, which showed mostly central sleep apnea, he then had a PAP titration study on 05/16/20.  Please call and inform patient that I have entered an order for treatment with positive airway pressure (PAP) treatment for his sleep apnea.  Unfortunately, he did not sleep very well.  He had trouble tolerating the BiPAP.  Nevertheless, he had reduction of his sleep apnea with BiPAP.  I would like for him to start treatment at home, the machine will be supplied from a local DME company.  I have placed an order in his chart and he should get contacted by the DME company shortly after we processed the order.  He will need a follow-up appointment in sleep clinic after 60 to 90 days after starting treatment.  He can see one of our nurse practitioners as well.   Please also explain to the patient that I will be looking out for compliance data, which can be downloaded from the machine (stored on an SD card, that is inserted in the machine) or via remote access through a modem, that is built into the machine. At the time of the followup appointment we will discuss sleep study results and how it is going with PAP treatment at home. Please advise patient to bring His machine at the time of the first FU visit, even though this is cumbersome. Bringing the machine for every visit after that will likely not be needed, but often helps for the first visit to troubleshoot if needed. Please re-enforce the importance of compliance with treatment and the need for [place] to monitor compliance data - often an insurance requirement and actually good feedback for the patient as far as how they are doing.  Also remind patient, that any interim PAP machine or mask issues should be first addressed with the DME company, as they can often help better with technical and mask fit issues. Please ask if patient has a  preference regarding DME company.   Huston Foley, MD, PhD [facility])

## 2020-06-03 NOTE — Telephone Encounter (Signed)
I called pt. I advised pt that Dr. Frances Furbish reviewed their sleep study results and found that pt did the best during his most recent sleep study on BiPAP. Dr. Frances Furbish recommends that pt start BiPAP treatment at home for this.. I reviewed PAP compliance expectations with the pt. Pt is agreeable to starting a BiPAP. I advised pt that an order will be sent to a DME, [employer], and [employer] will call the pt within about one week after they file with the pt's insurance. [employer] will show the pt how to use the machine, fit for masks, and troubleshoot the BiPAP if needed. A follow up appt was made for insurance purposes with Dr. Frances Furbish on 08/26/2020 at 1 pm. Pt verbalized understanding to arrive 15 minutes early and bring their BiPAP. A letter with all of this information in it will be mailed to the pt as a reminder. I verified with the pt that the address we have on file is correct. Pt verbalized understanding of results. Pt had no questions at this time but was encouraged to call back if questions arise. I have sent the order to [employer] and have received confirmation that they have received the order.

## 2020-06-07 ENCOUNTER — Other Ambulatory Visit: Payer: Self-pay | Admitting: Pharmacist

## 2020-06-07 DIAGNOSIS — I10 Essential (primary) hypertension: Secondary | ICD-10-CM

## 2020-06-08 MED ORDER — HYDROCHLOROTHIAZIDE 12.5 MG PO CAPS: 12.5000 mg | ORAL_CAPSULE | Freq: Every morning | ORAL | 0 refills | Status: DC

## 2020-06-12 ENCOUNTER — Other Ambulatory Visit: Payer: Self-pay | Admitting: Cardiology

## 2020-07-25 ENCOUNTER — Other Ambulatory Visit: Payer: Self-pay

## 2020-07-25 ENCOUNTER — Encounter: Payer: Self-pay | Admitting: Cardiology

## 2020-07-25 ENCOUNTER — Ambulatory Visit: Payer: Medicare Other | Admitting: Cardiology

## 2020-07-25 VITALS — BP 131/87 | HR 81 | Ht 67.0 in | Wt 178.0 lb

## 2020-07-25 DIAGNOSIS — G4733 Obstructive sleep apnea (adult) (pediatric): Secondary | ICD-10-CM

## 2020-07-25 DIAGNOSIS — E039 Hypothyroidism, unspecified: Secondary | ICD-10-CM

## 2020-07-25 DIAGNOSIS — I10 Essential (primary) hypertension: Secondary | ICD-10-CM

## 2020-07-25 DIAGNOSIS — E782 Mixed hyperlipidemia: Secondary | ICD-10-CM

## 2020-07-25 DIAGNOSIS — Z9989 Dependence on other enabling machines and devices: Secondary | ICD-10-CM

## 2020-07-25 NOTE — Progress Notes (Signed)
Quentin Cornwall Nepomuceno Date of Birth: [birth year] MRN: [id number] Primary Care Provider:Pharr, Zollie Beckers, MD Former Cardiology Providers: Toniann Fail, MSN, APRN, FNP-C Primary Cardiologist: Tessa Lerner, DO  Date: 07/25/20 Last Office Visit: 01/24/2020  Chief Complaint  Patient presents with   Hypertension   Follow-up   HPI  Edward Conway is a 79 y.o.  male who presents to the office with a chief complaint of " hypertension follow-up." Patient's past medical history and cardiovascular risk factors include: Hypertension, hyperlipidemia, OSA on CPAP, hypothyroidism, advanced age.  Patient is here accompanied by her daughter Joni Reining at today's visit.  Patient is being followed for the management of high blood pressure.  Since last office visit patient states that he is doing well from a cardiovascular standpoint.  He denies any chest pain or shortness of breath at rest or with effort related activities.  He is still very active maintaining his at [place] acres [place] home.  Since last office visit his blood pressures are improving.  His ambulatory blood pressure monitoring along reviewed with him at today's office visit.  No recent hospitalizations or urgent care visits for cardiovascular symptoms.  Since last visit he is also been diagnosed with obstructive sleep apnea and currently working on getting a CPAP machine.  Patient states that he will have his cholesterol checked at his upcoming well visit.  He is recommended to send [place] a copy.  FUNCTIONAL STATUS: very active with managing his 6 acres at home.  ALLERGIES: No Known Allergies   MEDICATION LIST PRIOR TO VISIT: Current Outpatient Medications on File Prior to Visit  Medication Sig Dispense Refill   ezetimibe (ZETIA) 10 MG tablet TAKE 1 TABLET BY MOUTH ONCE A DAY 30 tablet 0   hydrochlorothiazide (MICROZIDE) 12.5 MG capsule Take 1 capsule (12.5 mg total) by mouth in the morning. 90 capsule 0   levothyroxine (SYNTHROID) 88  MCG tablet Take 88 mcg by mouth daily before breakfast.     metoprolol succinate (TOPROL-XL) 50 MG 24 hr tablet TAKE 1 TABLET BY MOUTH DAILY. TAKE WITH OR IMMEDIATELY FOLLOWING A MEAL. 30 tablet 2   olmesartan (BENICAR) 40 MG tablet Take 1 tablet (40 mg total) by mouth daily. 30 tablet 0   rosuvastatin (CRESTOR) 20 MG tablet TAKE 1 TABLET BY MOUTH DAILY. 30 tablet 6   No current facility-administered medications on file prior to visit.    PAST MEDICAL HISTORY: Past Medical History:  Diagnosis Date   Hyperlipidemia    Hypertension    OSA on CPAP     PAST SURGICAL HISTORY: Past Surgical History:  Procedure Laterality Date   THYROID SURGERY      FAMILY HISTORY: The patient's family history includes Colon cancer in his mother; Healthy in his father; Hypertension in his brother.   SOCIAL HISTORY:  The patient  reports that he has never smoked. He has never used smokeless tobacco. He reports current alcohol use of about 2.0 standard drinks of alcohol per week. He reports that he does not use drugs.  Review of Systems  Constitutional: Negative for chills and fever.  HENT: Negative for ear discharge, ear pain and nosebleeds.   Eyes: Negative for blurred vision and discharge.  Cardiovascular: Negative for chest pain, claudication, dyspnea on exertion, leg swelling, near-syncope, orthopnea, palpitations, paroxysmal nocturnal dyspnea and syncope.  Respiratory: Positive for snoring. Negative for cough and shortness of breath.   Endocrine: Negative for polydipsia, polyphagia and polyuria.  Hematologic/Lymphatic: Negative for bleeding problem.  Skin: Negative  for flushing and nail changes.  Musculoskeletal: Negative for muscle cramps, muscle weakness and myalgias.  Gastrointestinal: Negative for abdominal pain, dysphagia, hematemesis, hematochezia, melena, nausea and vomiting.  Neurological: Negative for dizziness, focal weakness and light-headedness.    PHYSICAL EXAM: Vitals with  BMI 07/25/2020 02/28/2020 01/24/2020  Height 5\' 7"  5\' 7"  5\' 7"   Weight 178 lbs 172 lbs 5 oz 174 lbs  BMI 27.87 26.98 27.25  Systolic 131 127  Diastolic 87 87 79  Pulse 81 70 74    CONSTITUTIONAL: Well-developed and well-nourished. No acute distress.  SKIN: Skin is warm and dry. No rash noted. No cyanosis. No pallor. No jaundice HEAD: Normocephalic and atraumatic.  EYES: No scleral icterus MOUTH/THROAT: Moist oral membranes.  NECK: No JVD present. No thyromegaly noted. No carotid bruits  LYMPHATIC: No visible cervical adenopathy.  CHEST Normal respiratory effort. No intercostal retractions  LUNGS: Clear to auscultation bilaterally.  No stridor. No wheezes. No rales.  CARDIOVASCULAR: Regular rate rhythm, positive S1-S2, soft systolic ejection murmur heard, gallops or rubs. ABDOMINAL: Nonobese, soft, nontender, nondistended, positive bowel sounds in all 4 quadrants no apparent ascites.  EXTREMITIES: No peripheral edema  HEMATOLOGIC: No significant bruising NEUROLOGIC: Oriented to person, place, and time. Nonfocal. Normal muscle tone.  PSYCHIATRIC: Normal mood and affect. Normal behavior. Cooperative  CARDIAC DATABASE: EKG: 07/25/2020: Normal sinus rhythm, 65 bpm, left axis deviation, left anterior fascicular block, first-degree AV block, old inferior infarct, without underlying injury pattern.   Echocardiogram: 07/12/2019: LVEF 50%, mild concentric LVH, mild MR, no other significant valvular abnormalities.  Stress Testing:  Exercise tetrofosmin stress test 08/07/2019: Resting EKG normal sinus rhythm, IRBBB. Stress EKG negative for myocardial ischemia. Patient exercised on Avanish protocol for 5:00 minutes, achieving approximately 5.4 METs. Exercise was terminated due to fatigue/weakness. Myocardial perfusion imaging is normal. Left ventricular ejection fraction is 53% with normal wall motion. Low risk study.  Heart Catheterization: None  LABORATORY DATA: CBC Latest Ref Rng &  Units 06/26/2019  WBC 4.0 - 10.5 K/uL 10.0  Hemoglobin 13.0 - 17.0 g/dL 17.9(H)  Hematocrit 39 - 52 % 52.8(H)  Platelets 150 - 400 K/uL 189    CMP Latest Ref Rng & Units 03/15/2020 06/26/2019  Glucose 65 - 99 mg/dL 97 06/28/2019)  BUN 8 - 27 mg/dL 13 13  Creatinine 05/15/2020 - 1.27 mg/dL 06/28/2019 725(D  Sodium 6.64 - 144 mmol/L 139 139  Potassium 3.5 - 5.2 mmol/L 4.1 3.9  Chloride 96 - 106 mmol/L 100 102  CO2 20 - 29 mmol/L 25 27  Calcium 8.6 - 10.2 mg/dL 9.9 4.03  Total Protein 6.5 - 8.1 g/dL - 7.5  Total Bilirubin 0.3 - 1.2 mg/dL - 1.1  Alkaline Phos 38 - 126 U/L - 61  AST 15 - 41 U/L - 23  ALT 0 - 44 U/L - 20    Lipid Panel  No results found for: CHOL, TRIG, HDL, CHOLHDL, VLDL, LDLCALC, LDLDIRECT, LABVLDL  No results found for: HGBA1C No components found for: NTPROBNP No results found for: TSH  Cardiac Panel (last 3 results) No results for input(s): CKTOTAL, CKMB, TROPONINIHS, RELINDX in the last 72 hours.  FINAL MEDICATION LIST END OF ENCOUNTER: No orders of the defined types were placed in this encounter.    Current Outpatient Medications:    ezetimibe (ZETIA) 10 MG tablet, TAKE 1 TABLET BY MOUTH ONCE A DAY, Disp: 30 tablet, Rfl: 0   hydrochlorothiazide (MICROZIDE) 12.5 MG capsule, Take 1 capsule (12.5 mg total) by mouth in the  morning., Disp: 90 capsule, Rfl: 0   levothyroxine (SYNTHROID) 88 MCG tablet, Take 88 mcg by mouth daily before breakfast., Disp: , Rfl:    metoprolol succinate (TOPROL-XL) 50 MG 24 hr tablet, TAKE 1 TABLET BY MOUTH DAILY. TAKE WITH OR IMMEDIATELY FOLLOWING A MEAL., Disp: 30 tablet, Rfl: 2   olmesartan (BENICAR) 40 MG tablet, Take 1 tablet (40 mg total) by mouth daily., Disp: 30 tablet, Rfl: 0   rosuvastatin (CRESTOR) 20 MG tablet, TAKE 1 TABLET BY MOUTH DAILY., Disp: 30 tablet, Rfl: 6  IMPRESSION:    ICD-10-CM   1. Essential hypertension  I10 EKG 12-Lead  2. Mixed hyperlipidemia  E78.2   3. Hypothyroidism, unspecified type  E03.9   4. OSA on CPAP   G47.33    Z99.89      RECOMMENDATIONS: SIRIS HOOS is a 79 y.o. male whose past medical history and cardiovascular risk factors include: Hypertension, hyperlipidemia, hypothyroidism, OSA on CPAP, advanced age.  Benign essential hypertension:  Office blood pressure is at goal.   Medication reconciled.   We will continue to monitor his blood pressures via principal care management.    Blood pressure log reviewed.    Patient is not requesting any refills on his antihypertensive medications.    Educated on importance of low-salt diet.  Obstructive sleep apnea on CPAP: At last office visit he was recommended to discuss undergoing sleep study with his PCP given his symptoms of snoring and high blood pressures.  Since then he did undergo sleep study and was diagnosed with obstructive sleep apnea.  He is in the process of obtaining the CPAP machine that is comfortable to use at night.  Educated on importance of its compliance.  Mixed hyperlipidemia:  Continue statin therapy and Zetia.    Follow lipids.  Recommended repeat lipid profile.  I do not have any lipid profiles reviewed with him at today's visit.  Patient's daughter states that he is due for repeat follow-up visit coming up.  I have asked him to have a copy of her labs sent to [place] for further review and from record.  Patient denies myalgia or other side effects.  From cardiovascular standpoint patient is doing well.  His blood pressures have been optimized on current medical regimen.  No active cardiovascular symptoms.  And he had recent cardiovascular testing.  Would recommend 1 year follow-up or sooner if needed.  Orders Placed This Encounter  Procedures   EKG 12-Lead   --Continue cardiac medications as reconciled in final medication list. --Return in about 1 year (around 07/25/2021) for Follow up, BP. Or sooner if needed. --Continue follow-up with your primary care physician regarding the management of your other  chronic comorbid conditions.  Patient's questions and concerns were addressed to his satisfaction. He voices understanding of the instructions provided during this encounter.   This note was created using a voice recognition software as a result there may be grammatical errors inadvertently enclosed that do not reflect the nature of this encounter. Every attempt is made to correct such errors.  Tessa Lerner, [place], [facility]  Pager: [phone] Office: [phone]

## 2020-08-13 ENCOUNTER — Telehealth: Payer: Self-pay | Admitting: Neurology

## 2020-08-13 NOTE — Telephone Encounter (Signed)
Pt's daughter Edward Conway called inquiring about her father's follow up appt. Also, she stated that she is struggling to try to get him to wear the cpap machine. She is wondering if a nasal mask would help the pt tolerate the cpap better. Also, she is wanting to know with him hardly wearing the cpap due the need to move out follow up appt. Please advise.

## 2020-08-13 NOTE — Telephone Encounter (Signed)
I called pt's daughter Joni Reining per [place]. Pt does not use his bipap. Set up was 06/11/2020. Pt's daughter is unclear whether pt is forgetting to use it or just does not want to use it and chooses not to put on the mask before bed. I reminded her of insurance requirement of daily usage > 4 hours. If pt would like another mask I encouraged them to reach out the the DME. However, I recommended that they at least try the mask he was originally fitted for first before asking for another. Pt's appt was pushed out until 09/10/2020 at 9:30am. Pt's daughter verbalized understanding of recommendations and of new appt date and time.

## 2020-08-26 ENCOUNTER — Ambulatory Visit: Payer: Self-pay | Admitting: Neurology

## 2020-09-09 ENCOUNTER — Other Ambulatory Visit: Payer: Self-pay | Admitting: Cardiology

## 2020-09-09 DIAGNOSIS — I10 Essential (primary) hypertension: Secondary | ICD-10-CM

## 2020-09-10 ENCOUNTER — Encounter: Payer: Self-pay | Admitting: Neurology

## 2020-09-10 ENCOUNTER — Ambulatory Visit (INDEPENDENT_AMBULATORY_CARE_PROVIDER_SITE_OTHER): Payer: Medicare Other | Admitting: Neurology

## 2020-09-10 VITALS — BP 129/89 | HR 71 | Ht 67.0 in | Wt 173.0 lb

## 2020-09-10 DIAGNOSIS — G4731 Primary central sleep apnea: Secondary | ICD-10-CM

## 2020-09-10 DIAGNOSIS — G4733 Obstructive sleep apnea (adult) (pediatric): Secondary | ICD-10-CM | POA: Diagnosis not present

## 2020-09-10 DIAGNOSIS — Z789 Other specified health status: Secondary | ICD-10-CM | POA: Diagnosis not present

## 2020-09-10 IMAGING — CT CT HEAD W/O CM
3 series · 15 of 47 positions shown, 18 images · non-contrast
Comparison: None.

CLINICAL DATA: Focal neurologic deficit for greater than 6 hours.

EXAM:
CT HEAD WITHOUT CONTRAST
TECHNIQUE: Contiguous axial images were obtained from the base of the skull
through the vertex without intravenous contrast.

[Series 2: head wo · axial · 0.40mm/px · z∈[+1058,+1184]mm · 9 of 31 slices shown, 12 images]
[im 3/31  brain]
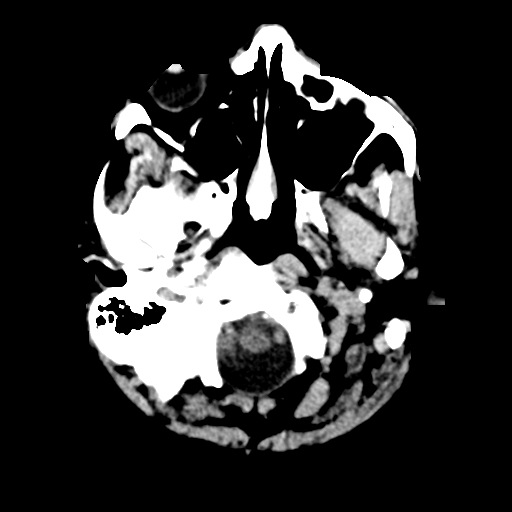
[im 3/31  bone]
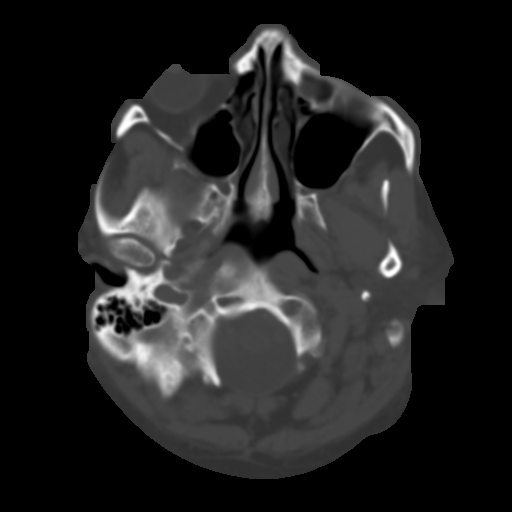
[im 6/31  brain]
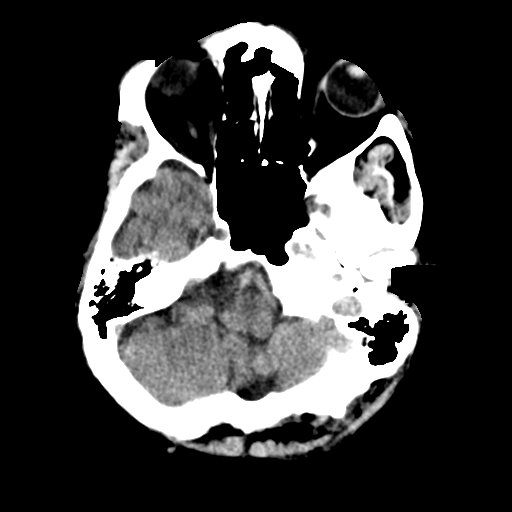
[im 9/31  brain]
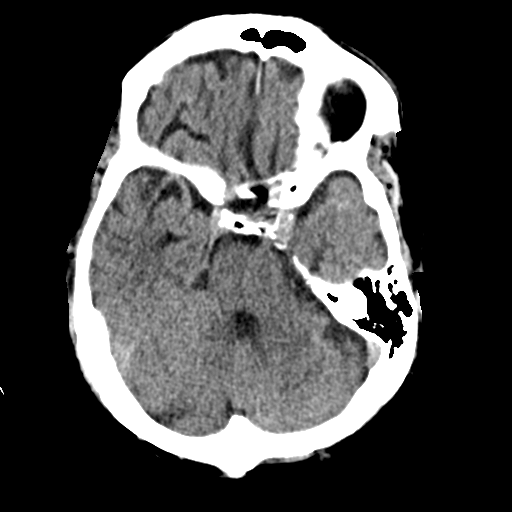
[im 12/31  brain]
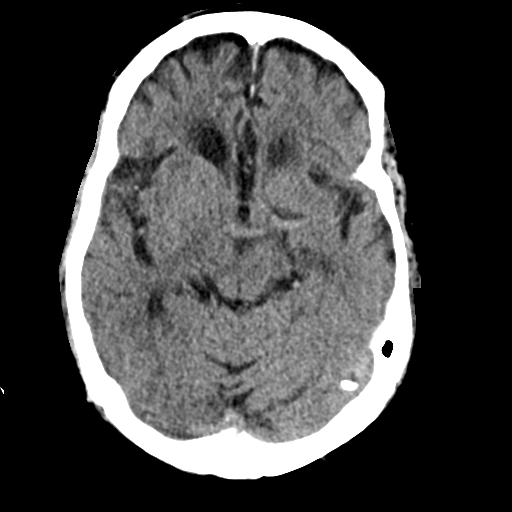
[im 16/31  brain]
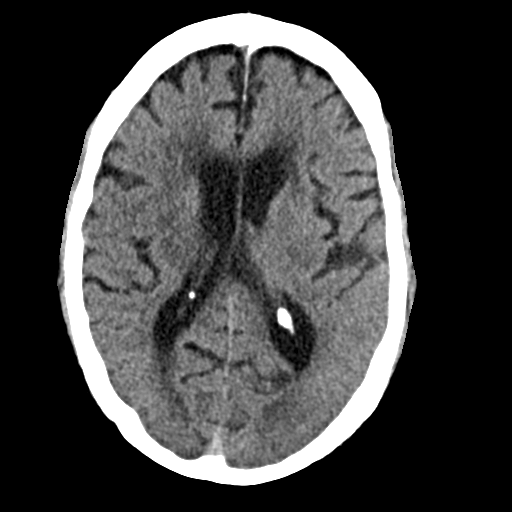
[im 16/31  bone]
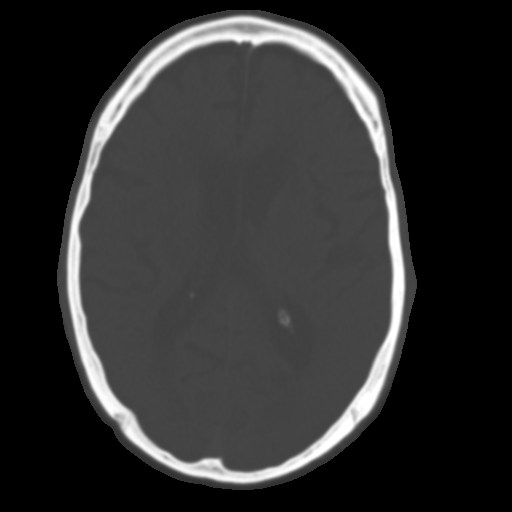
[im 19/31  brain]
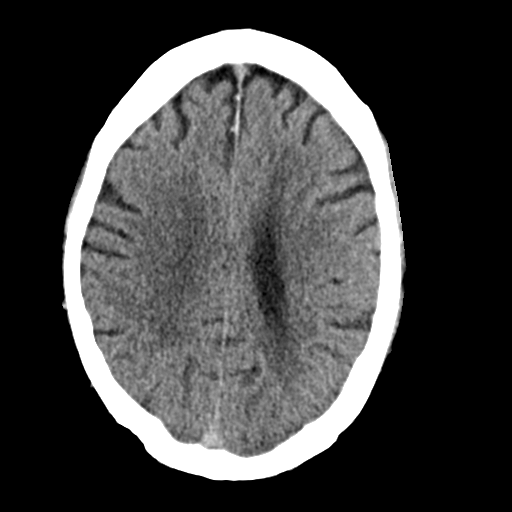
[im 22/31  brain]
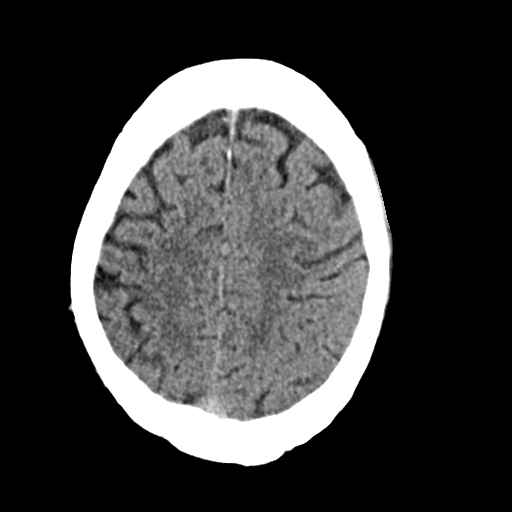
[im 25/31  brain]
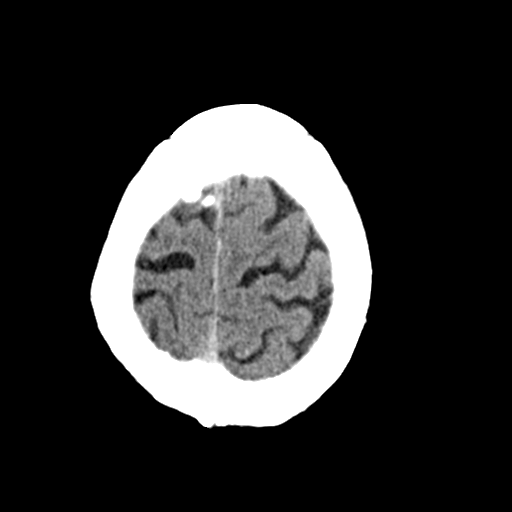
[im 28/31  brain]
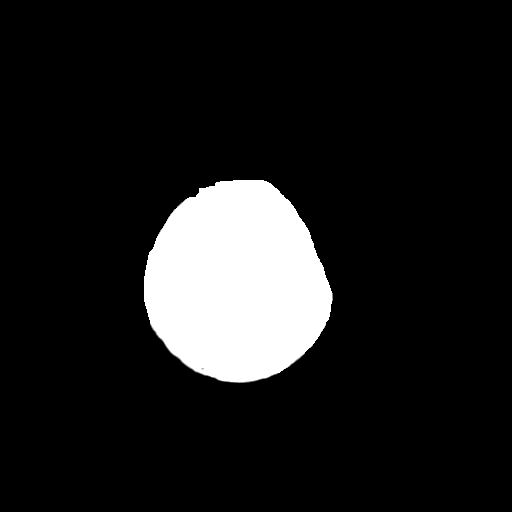
[im 28/31  bone]
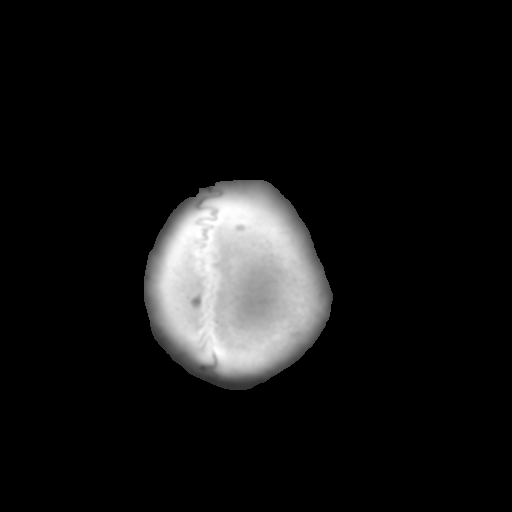

[Series 4: coronal soft · coronal · 0.30mm/px · 3 of 68 slices shown]
[im 23/68  brain]
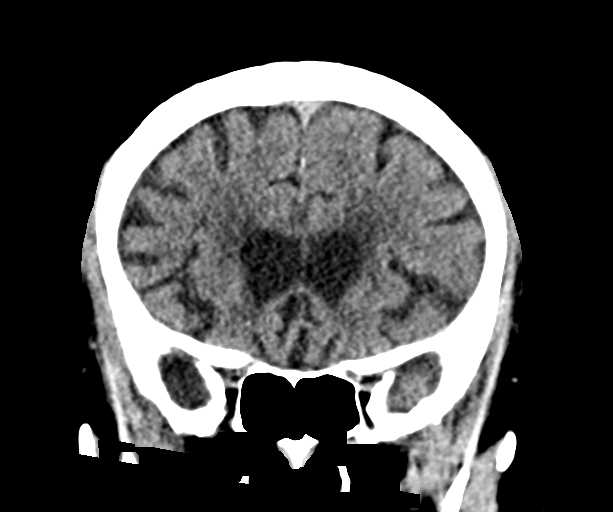
[im 30/68  brain]
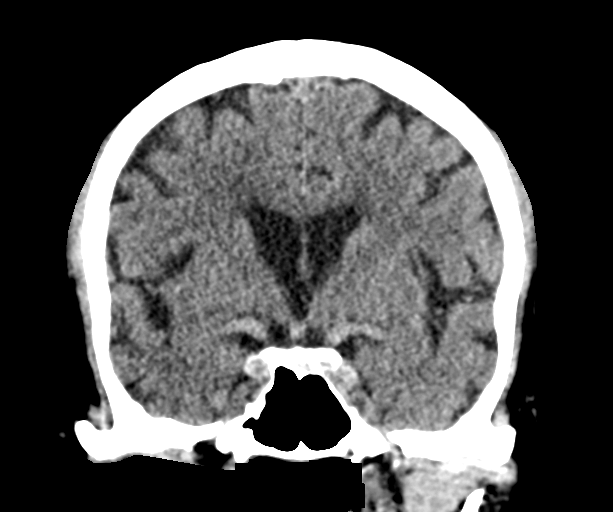
[im 38/68  brain]
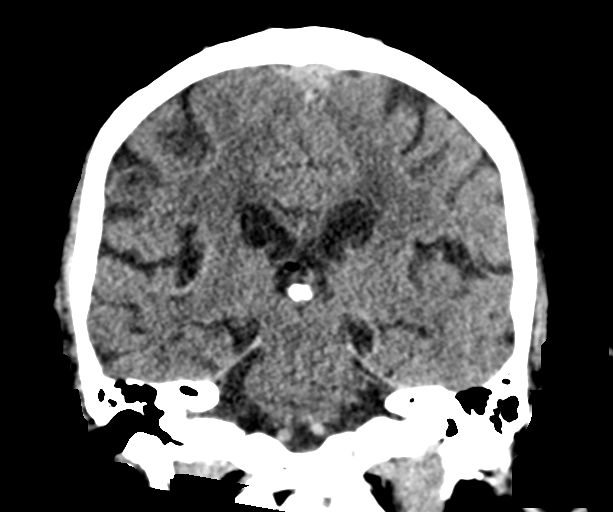

[Series 5: sag soft · sagittal · 0.30mm/px · 3 of 61 slices shown]
[im 24/61  brain]
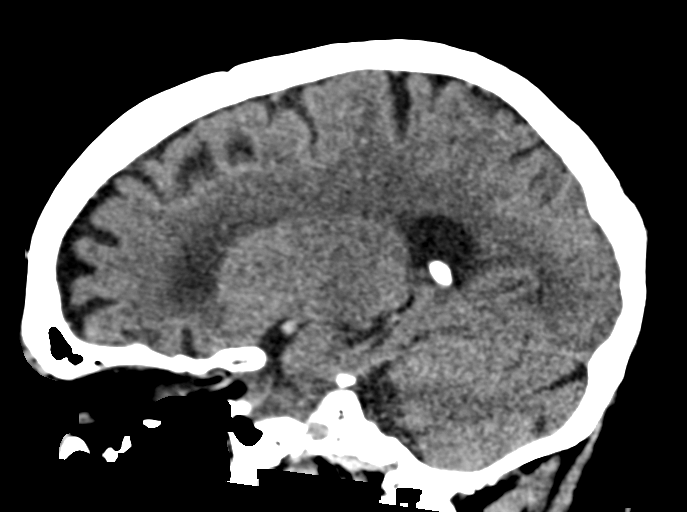
[im 31/61  brain]
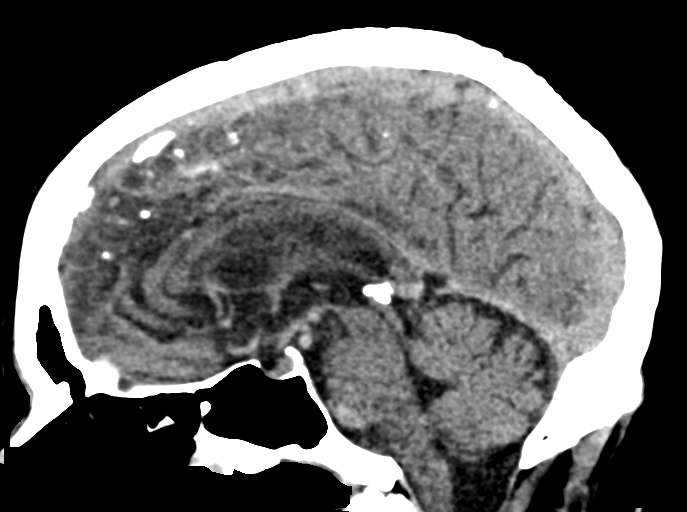
[im 37/61  brain]
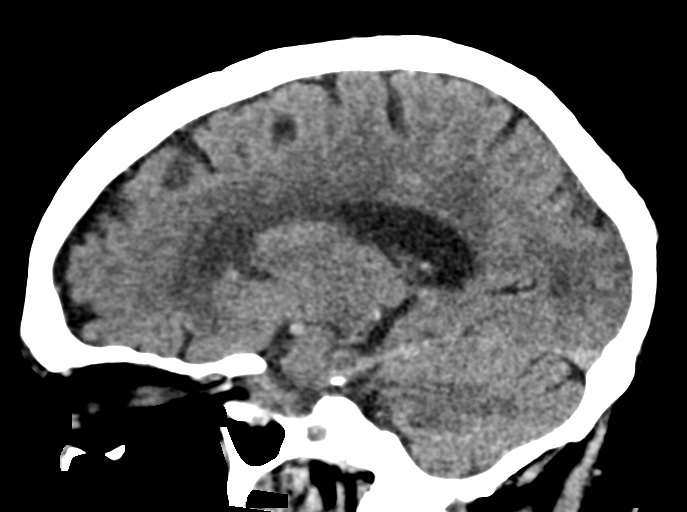

[15 of 47 positions shown; findings below may reference images not displayed]

FINDINGS: Brain: No evidence of acute infarction, hemorrhage, hydrocephalus,
extra-axial collection or mass lesion/mass effect. There is
mild-to-moderate cerebral volume loss with associated ex vacuo
ventricular dilatation.

Vascular: No hyperdense vessel or unexpected calcification.

Skull: Normal. Negative for fracture or focal lesion.

Sinuses/Orbits: No acute finding.

Other: None.
IMPRESSION: No acute intracranial process.

## 2020-09-10 NOTE — Patient Instructions (Addendum)
It was nice to see you both again today.   I understand, you have had trouble tolerating the BiPAP pressure and the mask.   As discussed, I will reduce the pressure to see if you tolerate the treatment better with a lower pressure.  Please also make an appointment with your DME company, [employer], for a mask refit and to consider a different type and size of mask such as a nasal mask or nasal cushion interface.  Please continue using your BiPAP regularly. While your insurance requires that you use BiPAP at least 4 hours each night on 70% of the nights, I recommend, that you not skip any nights and use it throughout the night if you can. Getting used to BiPAP and staying with the treatment long term does take time and patience and discipline. Untreated obstructive sleep apnea when it is moderate to severe can have an adverse impact on cardiovascular health and raise her risk for heart disease, arrhythmias, hypertension, congestive heart failure, stroke and diabetes. Untreated central and obstructive sleep apnea causes sleep disruption, nonrestorative sleep, and sleep deprivation. This can have an impact on your day to day functioning and cause daytime sleepiness and impairment of cognitive function, memory loss, mood disturbance, and problems focussing. Using BiPAP regularly can improve these symptoms.

## 2020-09-10 NOTE — Progress Notes (Signed)
Order for mask refit and bipap pressure change sent to [employer] via community message. Confirmation received that the order transmitted was successful.  

## 2020-09-10 NOTE — Progress Notes (Signed)
Subjective:    Patient ID: NICOLE HAFLEY is a 79 y.o. male.  HPI     Interim history:   Mr. Cina is a 79 year old right-handed gentleman with an underlying medical history of hypertension, hyperlipidemia, hypothyroidism and overweight state, who presents for Follow-up consultation of his central sleep apnea after interim testing and starting BiPAP ST.  Patient is accompanied by his daughter again today.  I first met him at the request of his primary care physician on 02/28/2020, at which time he reported snoring and daytime somnolence as well as memory issues.  He was advised to proceed with sleep testing.  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  He was therefore advised to return for a titration study.  He had a titration study on 05/16/2020 but had significant difficulty initiating and maintaining sleep with a sleep efficiency markedly reduced at 34.5%.  REM sleep was mildly delayed at 140 minutes, REM percentage reduced at 15.4%.  He was titrated on CPAP initially from 6 to 12 cm but had increase in central apneas.  He had difficulty tolerating standard BiPAP therapy and had significant difficulty maintaining sleep, ongoing central apneas.  He was therefore switched to BiPAP ST with a backup rate of 12/min.  Based on the limited test results I suggested home BiPAP ST therapy at 16/11 centimeters.  Today, 09/10/2020: I reviewed his BiPAP compliance data from the past 91 days from 06/11/2020 through 09/09/2020, during which time he used his machine only 5 days with percent use days greater than 4 hours at 0%.  Residual AHI highly elevated at 41.4/h based on the limited data, pressure of 16/11 with a rate of 12.  He reports trying to remember to use his BiPAP but has had trouble tolerating the pressure.  He has also had difficulty remembering to use it.  He is willing to try to do better and  motivated to continue with treatment.  His daughter is also concerned that he may not be able to tolerate the mask as well.  He is using a full facemask.  He would be willing to seek a mask refit appointment with his DME provider and try a lower pressure.  He is still taking naps during the day and would also be willing to try to use his machine during his naps.  He had a checkup with his cardiologist which per patient was good, also a recent checkup with his primary care physician including blood work which per daughter were fine.  The patient's allergies, current medications, family history, past medical history, past social history, past surgical history and problem list were reviewed and updated as appropriate.   Previously:   02/28/20: (He) reports snoring and excessive daytime somnolence.  He has been more forgetful.  I reviewed your office note from 02/07/2020.  His Epworth sleepiness score is 9 out of 24, which may be an underestimation of his daytime somnolence as he does take MRI every day, sometimes 2+ hours.  He is widowed and lives alone, his daughter sees him frequently.  He has a son in [place].  The patient has had some forgetfulness and this includes forgetting medications, forgetting dates or events.  He drives, he has not had any trouble driving.  He is retired as a Forensic psychologist for some gentle.  He is a non-smoker and drinks alcohol occasionally, drinks caffeine in the  form of soda, about 2 servings per day, has been in the process of reducing his soda intake per daughter.  She has also encouraged more water intake, he averages about 2 8 ounce servings of water per day by his estimate.  Bedtime is around 11 and rise time between 7 and 8.  He has significant nocturia about 3-4 times per average night, denies recurrent morning headaches, denies a family history of OSA and has not had a tonsillectomy.  He had a recent visit with his cardiologist and was encouraged to consider sleep apnea  evaluation.  He had a sleep study some 5 or 6 years ago and CPAP therapy was not recommended at the time.  He has had some blood pressure fluctuations.  His amlodipine was increased in the recent past to 10 mg. In the recent 6 to 12 months he has been noted to have parasomnias particularly yelling out in his sleep.  This has been witnessed by his granddaughter.  His Past Medical History Is Significant For: Past Medical History:  Diagnosis Date  . Hyperlipidemia   . Hypertension   . OSA on CPAP     His Past Surgical History Is Significant For: Past Surgical History:  Procedure Laterality Date  . THYROID SURGERY      His Family History Is Significant For: Family History  Problem Relation Age of Onset  . Colon cancer Mother   . Healthy Father   . Hypertension Brother     His Social History Is Significant For: Social History   Socioeconomic History  . Marital status: Widowed    Spouse name: Not on file  . Number of children: 2  . Years of education: Not on file  . Highest education level: Not on file  Occupational History  . Not on file  Tobacco Use  . Smoking status: Never Smoker  . Smokeless tobacco: Never Used  Vaping Use  . Vaping Use: Never used  Substance and Sexual Activity  . Alcohol use: Yes    Alcohol/week: 2.0 standard drinks    Types: 2 Cans of beer per week    Comment: occ  . Drug use: No  . Sexual activity: Not on file  Other Topics Concern  . Not on file  Social History Narrative  . Not on file   Social Determinants of Health   Financial Resource Strain:   . Difficulty of Paying Living Expenses: Not on file  Food Insecurity:   . Worried About Charity fundraiser in the Last Year: Not on file  . Ran Out of Food in the Last Year: Not on file  Transportation Needs:   . Lack of Transportation (Medical): Not on file  . Lack of Transportation (Non-Medical): Not on file  Physical Activity:   . Days of Exercise per Week: Not on file  . Minutes of  Exercise per Session: Not on file  Stress:   . Feeling of Stress : Not on file  Social Connections:   . Frequency of Communication with Friends and Family: Not on file  . Frequency of Social Gatherings with Friends and Family: Not on file  . Attends Religious Services: Not on file  . Active Member of Clubs or Organizations: Not on file  . Attends Archivist Meetings: Not on file  . Marital Status: Not on file    His Allergies Are:  No Known Allergies:   His Current Medications Are:  Outpatient Encounter Medications as of 09/10/2020  Medication Sig  .  ezetimibe (ZETIA) 10 MG tablet TAKE 1 TABLET BY MOUTH ONCE A DAY  . hydrochlorothiazide (MICROZIDE) 12.5 MG capsule TAKE 1 CAPSULE (12.5 MG TOTAL) BY MOUTH IN THE MORNING.  Marland Kitchen levothyroxine (SYNTHROID) 88 MCG tablet Take 88 mcg by mouth daily before breakfast.  . metoprolol succinate (TOPROL-XL) 50 MG 24 hr tablet TAKE 1 TABLET BY MOUTH DAILY. TAKE WITH OR IMMEDIATELY FOLLOWING A MEAL.  Marland Kitchen olmesartan (BENICAR) 40 MG tablet Take 1 tablet (40 mg total) by mouth daily.  . rosuvastatin (CRESTOR) 20 MG tablet TAKE 1 TABLET BY MOUTH DAILY.   No facility-administered encounter medications on file as of 09/10/2020.  :  Review of Systems:  Out of a complete 14 point review of systems, all are reviewed and negative with the exception of these symptoms as listed below: Review of Systems  Neurological:       Pt presents today to discuss his bipap. He is not using his bipap because he forgets to use it. He takes an almost daily nap.    Objective:  Neurological Exam  Physical Exam Physical Examination:   Vitals:   09/10/20 0926  BP: 129/89  Pulse: 71    General Examination: The patient is a very pleasant 79 y.o. male in no acute distress. He appears well-developed and well-nourished and well groomed.   HEENT: Normocephalic, atraumatic, pupils are equal, round and reactive to light, extraocular tracking is good without  limitation to gaze excursion or nystagmus noted. Hearing is grossly intact. Face is symmetric with normal facial animation. Rosacea type changes noted in the face/nose area.   Band-Aid right parietal scalp. Speech is clear with no dysarthria noted. There is no hypophonia. There is no lip, neck/head, jaw or voice tremor. Neck is supple with FROM. Oropharynx exam reveals: mild mouth dryness, adequate dental hygiene and mild airway crowding, due to redundant soft palate. Mallampati is class II. Tongue protrudes centrally and palate elevates symmetrically.    Chest: Clear to auscultation without wheezing, rhonchi or crackles noted.  Heart: S1+S2+0, regular and normal without murmurs, rubs or gallops noted.   Abdomen: Soft, non-tender and non-distended with normal bowel sounds appreciated on auscultation.  Extremities: There is no obvious edema.     Skin: Warm and dry without trophic changes noted.   Musculoskeletal: exam reveals no obvious joint deformities, tenderness or joint swelling or erythema.   Neurologically:  Mental status: The patient is awake, alert and oriented in all 4 spheres. His immediate and remote memory, attention, language skills and fund of knowledge are fairly appropriate, but patient is not very wordy. Daughter provides details of the history. There is no evidence of aphasia, agnosia, apraxia or anomia. Speech is clear with normal prosody and enunciation. Thought process is linear. Mood is normal and affect is normal.  Cranial nerves II - XII are as described above under HEENT exam.  Motor exam: Normal bulk, strength and tone is noted. There is no tremor. Fine motor skills and coordination: grossly intact.  Cerebellar testing: No dysmetria or intention tremor.  Sensory exam: intact to light touch in the upper and lower extremities.   Assessment and Plan:  In summary, JANN RA is a very pleasant 79 year old male with an underlying medical history of  hypertension, hyperlipidemia, hypothyroidism and overweight state, who presents for follow-up consultation of his sleep disordered breathing.  Home sleep testing on 04/08/2020 indicated primary central sleep apnea with a central AHI of 46/h, total AHI of 64.7/h, O2 nadir of 82%.  He  had a difficult and challenging titration study on 05/16/2020, had significant difficulty maintaining sleep.  He is often forgetting to put the BiPAP on.  He has had some trouble tolerating the pressure in the mask.  Today, I suggested he seek a mask fit appointment with his DME provider and I made a referral in the chart for this.  In addition, I suggested we try to reduce his pressure to see if he has better tolerance with a lower pressure.  To that end, I would like to reduce the pressure from 16/11 centimeters to 12/8 centimeters.  He is willing to try this.  He is motivated to continue with treatment and his daughter also indicates that she will try to put a reminder alarm for his BiPAP at night.  Unfortunately, given his central sleep apnea, alternative treatments are rather limited.  He is also advised that compliance will improve and tolerance will improve over time the more he tries to use it consistently.  He is agreeable with the approach of reducing the pressure and changing the mask if feasible.  He is advised to follow-up routinely to see one of our nurse practitioners in about 2 months.  I answered all the questions today and the patient and his daughter were in agreement.   I spent 30 minutes in total face-to-face time and in reviewing records during pre-charting, more than 50% of which was spent in counseling and coordination of care, reviewing test results, reviewing medications and treatment regimen and/or in discussing or reviewing the diagnosis of CSA, OSA, the prognosis and treatment options. Pertinent laboratory and imaging test results that were available during this visit with the patient were reviewed by me and  considered in my medical decision making (see chart for details).

## 2020-09-15 NOTE — Progress Notes (Signed)
External Labs: Collected: 08/06/2020 Creatinine 1 mg/dL. AST 20, ALT 27 Lipid profile: Total cholesterol 116, triglycerides 111, HDL 42, LDL 52, non HDL 74.  Hemaglobin: 16.3 g/dL TSH: 3.87

## 2020-09-20 NOTE — Progress Notes (Signed)
Called pt, no answer. Left VM requesting call back. AD/S

## 2020-09-20 NOTE — Progress Notes (Signed)
Per Dr. Odis Hollingshead :Labs within normal limits.   Called patient, NA, LMAM.

## 2020-09-23 NOTE — Progress Notes (Signed)
Have made several attempts to contact patient, no answer. Left vm requesting call back. AD/S

## 2020-11-02 ENCOUNTER — Other Ambulatory Visit: Payer: Self-pay | Admitting: Cardiology

## 2020-12-11 ENCOUNTER — Ambulatory Visit: Payer: Medicare Other | Admitting: Family Medicine

## 2020-12-30 ENCOUNTER — Other Ambulatory Visit: Payer: Self-pay | Admitting: Cardiology

## 2020-12-31 ENCOUNTER — Encounter: Payer: Self-pay | Admitting: Pharmacist

## 2020-12-31 NOTE — Progress Notes (Signed)
CARE PLAN ENTRY  12/31/2020 Name: Edward Conway MRN: [id number] DOB: [birth year]  Edward Conway is enrolled in Remote Patient Monitoring/Principle Care Monitoring.  Date of Enrollment: 12/14/2019 Supervising physician: Tessa Lerner Indication: HTN  Remote Readings: Not Compliant and Avg BP: 136/95, HR:71  Next scheduled OV: 07/25/21  Pharmacist Clinical Goal(s):  Marland Kitchen Over the next 90 days, patient will demonstrate Improved medication adherence as evidenced by medication fill history . Over the next 90 days, patient will demonstrate improved understanding of prescribed medications and rationale for usage as evidenced by patient teach back . Over the next 90 days, patient will experience decrease in ED visits. ED visits in last 6 months = 0 . Over the next 90 days, patient will not experience hospital admission. Hospital Admissions in last 6 months = 0  Interventions: . Provider and Inter-disciplinary care team collaboration (see longitudinal plan of care) . Comprehensive medication review performed. . Discussed plans with patient for ongoing care management follow up and provided patient with direct contact information for care management team . Collaboration with provider re: medication management  Patient Self Care Activities:  . Self administers medications as prescribed . Attends all scheduled provider appointments . Performs ADL's independently . Performs IADL's independently  No Known Allergies Outpatient Encounter Medications as of 12/31/2020  Medication Sig  . ezetimibe (ZETIA) 10 MG tablet TAKE 1 TABLET BY MOUTH ONCE A DAY  . hydrochlorothiazide (MICROZIDE) 12.5 MG capsule TAKE 1 CAPSULE (12.5 MG TOTAL) BY MOUTH IN THE MORNING.  Marland Kitchen levothyroxine (SYNTHROID) 88 MCG tablet Take 88 mcg by mouth daily before breakfast.  . metoprolol succinate (TOPROL-XL) 50 MG 24 hr tablet TAKE 1 TABLET BY MOUTH DAILY. TAKE WITH OR IMMEDIATELY FOLLOWING A MEAL.  Marland Kitchen olmesartan (BENICAR)  40 MG tablet Take 1 tablet (40 mg total) by mouth daily.  . rosuvastatin (CRESTOR) 20 MG tablet TAKE 1 TABLET BY MOUTH DAILY.   No facility-administered encounter medications on file as of 12/31/2020.    Hypertension   BP goal is:  <130/80  Office blood pressures are  BP Readings from Last 3 Encounters:  09/10/20 129/89  07/25/20 131/87  02/28/20 127/87    Patient is currently controlled on the following medications: HCTZ 12.5 mg, metoprolol 50 mg, olmesartan 40 mg  Patient checks BP at home 3-5x per week  Patient home BP readings are ranging: 113-147/87-104  Patient has failed these meds in the past: N/A  We discussed diet and exercise extensively  Plan  Continue current medications and control with diet and exercise   May consider increasing metoprolol dose if BP continues to remain elevated.  ______________ Visit Information SDOH (Social Determinants of Health) assessments performed: Yes.  Mr. Nouri was given information about Principle Care Management/Remote Patient Monitoring services today including:  1. RPM/PCM service includes personalized support from designated clinical staff supervised by his physician, including individualized plan of care and coordination with other care providers 2. 24/7 contact phone numbers for assistance for urgent and routine care needs. 3. Standard insurance, coinsurance, copays and deductibles apply for principle care management only during months in which we provide at least 30 minutes of these services. Most insurances cover these services at 100%, however patients may be responsible for any copay, coinsurance and/or deductible if applicable. This service may help you avoid the need for more expensive face-to-face services. 4. Only one practitioner may furnish and bill the service in a calendar month. 5. The patient may stop PCM/RPM services at any time (effective  at the end of the month) by phone call to the office staff.  Patient  agreed to services and verbal consent obtained.   Cassell Clement, Pharm.D. Clinical Pharmacist [facility] [phone]: 120

## 2021-02-20 ENCOUNTER — Ambulatory Visit: Payer: Medicare Other | Admitting: Family Medicine

## 2021-02-24 ENCOUNTER — Other Ambulatory Visit: Payer: Self-pay | Admitting: Cardiology

## 2021-07-07 ENCOUNTER — Other Ambulatory Visit: Payer: Self-pay | Admitting: Cardiology

## 2021-07-25 ENCOUNTER — Ambulatory Visit: Payer: Medicare Other | Admitting: Cardiology

## 2021-08-22 ENCOUNTER — Encounter: Payer: Self-pay | Admitting: Cardiology

## 2021-08-22 ENCOUNTER — Ambulatory Visit: Payer: Medicare Other | Admitting: Cardiology

## 2021-08-22 ENCOUNTER — Other Ambulatory Visit: Payer: Self-pay

## 2021-08-22 VITALS — BP 134/83 | HR 68 | Resp 16 | Ht 67.0 in | Wt 171.0 lb

## 2021-08-22 DIAGNOSIS — Z9989 Dependence on other enabling machines and devices: Secondary | ICD-10-CM

## 2021-08-22 DIAGNOSIS — E782 Mixed hyperlipidemia: Secondary | ICD-10-CM

## 2021-08-22 DIAGNOSIS — E039 Hypothyroidism, unspecified: Secondary | ICD-10-CM

## 2021-08-22 DIAGNOSIS — G4733 Obstructive sleep apnea (adult) (pediatric): Secondary | ICD-10-CM

## 2021-08-22 DIAGNOSIS — I10 Essential (primary) hypertension: Secondary | ICD-10-CM

## 2021-08-22 NOTE — Progress Notes (Signed)
Edward Conway Date of Birth: Mar 09, 1941 MRN: [id number] Primary Care Provider:Pharr, Thayer Jew, MD Former Cardiology Providers: Miquel Dunn, MSN, APRN, FNP-C Primary Cardiologist: Rex Kras, DO  Date: 08/22/21 Last Office Visit: 07/25/2020   Chief Complaint  Patient presents with   Hypertension   Follow-up   HPI  Edward Conway is a 80 y.o.  male who presents to the office with a chief complaint of " 1 year follow-up for hypertension management." Patient's past medical history and cardiovascular risk factors include: Hypertension, hyperlipidemia, OSA on CPAP, hypothyroidism, advanced age.  Patient is here accompanied by her daughter Edward Conway at today's visit.  Patient is being followed for the management of high blood pressure.  Presents today for annual visit.  Over the last 1 year patient states that he is doing well from a cardiovascular standpoint.  No hospitalizations or urgent care visits since last office encounter.  Patient was enrolled into principal care management for blood pressure monitoring.  However since he was not checking it often the shared decision was to discontinue the service.  At home patient states that his blood pressures are well controlled.  He recently had a follow-up visit with his PCP and according to them the labs were within acceptable limits.  FUNCTIONAL STATUS: very active with managing his 6 acres at home.  ALLERGIES: No Known Allergies   MEDICATION LIST PRIOR TO VISIT: Current Outpatient Medications on File Prior to Visit  Medication Sig Dispense Refill   cholecalciferol (VITAMIN D3) 25 MCG (1000 UNIT) tablet 1 tablet     ezetimibe (ZETIA) 10 MG tablet TAKE 1 TABLET BY MOUTH ONCE A DAY 30 tablet 0   hydrochlorothiazide (MICROZIDE) 12.5 MG capsule TAKE 1 CAPSULE (12.5 MG TOTAL) BY MOUTH IN THE MORNING. 90 capsule 3   levothyroxine (SYNTHROID) 88 MCG tablet Take 88 mcg by mouth daily before breakfast.     metoprolol succinate  (TOPROL-XL) 50 MG 24 hr tablet TAKE 1 TABLET BY MOUTH DAILY. TAKE WITH OR IMMEDIATELY FOLLOWING A MEAL. 30 tablet 2   olmesartan (BENICAR) 40 MG tablet Take 1 tablet (40 mg total) by mouth daily. 30 tablet 0   rosuvastatin (CRESTOR) 20 MG tablet TAKE 1 TABLET BY MOUTH DAILY. 30 tablet 6   vitamin B-12 (CYANOCOBALAMIN) 1000 MCG tablet 1 tablet     donepezil (ARICEPT) 10 MG tablet Take by mouth.     No current facility-administered medications on file prior to visit.    PAST MEDICAL HISTORY: Past Medical History:  Diagnosis Date   Hyperlipidemia    Hypertension    OSA on CPAP     PAST SURGICAL HISTORY: Past Surgical History:  Procedure Laterality Date   THYROID SURGERY      FAMILY HISTORY: The patient's family history includes Colon cancer in his mother; Healthy in his father; Hypertension in his brother.   SOCIAL HISTORY:  The patient  reports that he has never smoked. He has never used smokeless tobacco. He reports current alcohol use of about 2.0 standard drinks per week. He reports that he does not use drugs.  Review of Systems  Constitutional: Negative for chills and fever.  HENT:  Negative for ear discharge, ear pain and nosebleeds.   Eyes:  Negative for blurred vision and discharge.  Cardiovascular:  Negative for chest pain, claudication, dyspnea on exertion, leg swelling, near-syncope, orthopnea, palpitations, paroxysmal nocturnal dyspnea and syncope.  Respiratory:  Positive for snoring. Negative for cough and shortness of breath.   Endocrine: Negative for polydipsia,  polyphagia and polyuria.  Hematologic/Lymphatic: Negative for bleeding problem.  Skin:  Negative for flushing and nail changes.  Musculoskeletal:  Negative for muscle cramps, muscle weakness and myalgias.  Gastrointestinal:  Negative for abdominal pain, dysphagia, hematemesis, hematochezia, melena, nausea and vomiting.  Neurological:  Negative for dizziness, focal weakness and light-headedness.   PHYSICAL  EXAM: Vitals with BMI 08/22/2021 08/22/2021 09/10/2020  Height - _0  _1   Weight - 171 lbs 173 lbs  BMI - 62.83 66.29  Systolic 476 546 503  Diastolic 83 87 89  Pulse 68 67 71    CONSTITUTIONAL: Well-developed and well-nourished. No acute distress.  SKIN: Skin is warm and dry. No rash noted. No cyanosis. No pallor. No jaundice HEAD: Normocephalic and atraumatic.  EYES: No scleral icterus MOUTH/THROAT: Moist oral membranes.  NECK: No JVD present. No thyromegaly noted. No carotid bruits  LYMPHATIC: No visible cervical adenopathy.  CHEST Normal respiratory effort. No intercostal retractions  LUNGS: Clear to auscultation bilaterally.  No stridor. No wheezes. No rales.  CARDIOVASCULAR: Regular rate rhythm, positive T4-S5, soft systolic ejection murmur heard, gallops or rubs. ABDOMINAL: Nonobese, soft, nontender, nondistended, positive bowel sounds in all 4 quadrants no apparent ascites.  EXTREMITIES: No peripheral edema, warm to touch, 2+ DP and PT pulses. HEMATOLOGIC: No significant bruising NEUROLOGIC: Oriented to person, place, and time. Nonfocal. Normal muscle tone.  PSYCHIATRIC: Normal mood and affect. Normal behavior. Cooperative  CARDIAC DATABASE: EKG: 08/22/2021: Normal sinus rhythm, 62 bpm, first-degree AV block, I RBBB, left axis deviation, left anterior fascicular block without underlying injury pattern.    Echocardiogram: 07/12/2019: LVEF 50%, mild concentric LVH, mild MR, no other significant valvular abnormalities.  Stress Testing:  Exercise tetrofosmin stress test  08/07/2019: Resting EKG normal sinus rhythm, IRBBB. Stress EKG negative for myocardial ischemia. Patient exercised on [place] protocol for 5:00 minutes, achieving approximately 5.4 METs. Exercise was terminated due to fatigue/weakness. Myocardial perfusion imaging is normal. Left ventricular ejection fraction is 53% with normal wall motion. Low risk study.  Heart Catheterization: None  LABORATORY  DATA: CBC Latest Ref Rng & Units 06/26/2019  WBC 4.0 - 10.5 K/uL 10.0  Hemoglobin 13.0 - 17.0 g/dL 17.9(H)  Hematocrit 39.0 - 52.0 % 52.8(H)  Platelets 150 - 400 K/uL 189    CMP Latest Ref Rng & Units 03/15/2020 06/26/2019  Glucose 65 - 99 mg/dL 97 143(H)  BUN 8 - 27 mg/dL 13 13  Creatinine 0.76 - 1.27 mg/dL 1.11 0.96  Sodium 134 - 144 mmol/L 139 139  Potassium 3.5 - 5.2 mmol/L 4.1 3.9  Chloride 96 - 106 mmol/L 100 102  CO2 20 - 29 mmol/L 25 27  Calcium 8.6 - 10.2 mg/dL 9.9 10.2  Total Protein 6.5 - 8.1 g/dL - 7.5  Total Bilirubin 0.3 - 1.2 mg/dL - 1.1  Alkaline Phos 38 - 126 U/L - 61  AST 15 - 41 U/L - 23  ALT 0 - 44 U/L - 20    Lipid Panel  No results found for: CHOL, TRIG, HDL, CHOLHDL, VLDL, LDLCALC, LDLDIRECT, LABVLDL  No results found for: HGBA1C No components found for: NTPROBNP No results found for: TSH  Cardiac Panel (last 3 results) No results for input(s): CKTOTAL, CKMB, TROPONINIHS, RELINDX in the last 72 hours.  External Labs: Collected: August 12, 2021 records available in [facility]. Sodium 141, potassium 4.4, chloride 101, bicarb 31, BUN 10, creatinine 0.95 AST 23, ALT 21, alkaline phosphatase 60. eGFR 74 mL/min per 1.73 m Total cholesterol 116, triglycerides 70, HDL  50, LDL 52, non-HDL 66 TSH 0.78 Hemoglobin 16.2 g/dL, hematocrit 47.5%  FINAL MEDICATION LIST END OF ENCOUNTER: No orders of the defined types were placed in this encounter.    Current Outpatient Medications:    cholecalciferol (VITAMIN D3) 25 MCG (1000 UNIT) tablet, 1 tablet, Disp: , Rfl:    ezetimibe (ZETIA) 10 MG tablet, TAKE 1 TABLET BY MOUTH ONCE A DAY, Disp: 30 tablet, Rfl: 0   hydrochlorothiazide (MICROZIDE) 12.5 MG capsule, TAKE 1 CAPSULE (12.5 MG TOTAL) BY MOUTH IN THE MORNING., Disp: 90 capsule, Rfl: 3   levothyroxine (SYNTHROID) 88 MCG tablet, Take 88 mcg by mouth daily before breakfast., Disp: , Rfl:    metoprolol succinate (TOPROL-XL) 50 MG 24 hr tablet, TAKE 1 TABLET BY  MOUTH DAILY. TAKE WITH OR IMMEDIATELY FOLLOWING A MEAL., Disp: 30 tablet, Rfl: 2   olmesartan (BENICAR) 40 MG tablet, Take 1 tablet (40 mg total) by mouth daily., Disp: 30 tablet, Rfl: 0   rosuvastatin (CRESTOR) 20 MG tablet, TAKE 1 TABLET BY MOUTH DAILY., Disp: 30 tablet, Rfl: 6   vitamin B-12 (CYANOCOBALAMIN) 1000 MCG tablet, 1 tablet, Disp: , Rfl:    donepezil (ARICEPT) 10 MG tablet, Take by mouth., Disp: , Rfl:   IMPRESSION:    ICD-10-CM   1. Essential hypertension  I10 EKG 12-Lead    PCV ECHOCARDIOGRAM COMPLETE    2. Mixed hyperlipidemia  E78.2     3. OSA on CPAP  G47.33    Z99.89     4. Hypothyroidism, unspecified type  E03.9        RECOMMENDATIONS: FEDERICK LEVENE is a 80 y.o. male whose past medical history and cardiovascular risk factors include: Hypertension, hyperlipidemia, hypothyroidism, OSA on CPAP, advanced age.  Over the last 1 year patient has been doing well from a cardiovascular standpoint.  Though he is not being monitored under principal care management patient's daughter states that his home blood pressures are well controlled.  Medications reconciled.  Patient tries to consume a low-salt diet but could do better with regards to reducing his daily salt intake.  No hospitalizations or urgent care visits since last office encounter.  He denies any chest pain or shortness of breath at rest or with effort related activities.  No change in overall physical endurance.  He still stays active maintaining his 6 acres of land without exertional symptoms..   Recommend an echocardiogram prior to the next office visit to reevaluate his LVEF as it was mildly reduced back in 2020.  This would be a 3-year follow-up echo.  Patient and daughter are agreeable with the plan of care.  Cholesterol is currently being managed by PCP.  Patient had labs recently and was informed that they are within acceptable limits.  We will obtain a copy for reference as well.  Orders Placed This  Encounter  Procedures   EKG 12-Lead   PCV ECHOCARDIOGRAM COMPLETE   --Continue cardiac medications as reconciled in final medication list. --Return in about 1 year (around 08/22/2022) for Follow up, BP. Or sooner if needed. --Continue follow-up with your primary care physician regarding the management of your other chronic comorbid conditions.  Patient's questions and concerns were addressed to his satisfaction. He voices understanding of the instructions provided during this encounter.   This note was created using a voice recognition software as a result there may be grammatical errors inadvertently enclosed that do not reflect the nature of this encounter. Every attempt is made to correct such errors.  Mikael Skoda [place],  DO, [facility]  Pager: [phone] Office: [phone]

## 2021-08-24 ENCOUNTER — Encounter: Payer: Self-pay | Admitting: Cardiology

## 2021-09-17 ENCOUNTER — Other Ambulatory Visit: Payer: Self-pay | Admitting: Cardiology

## 2021-09-17 DIAGNOSIS — I10 Essential (primary) hypertension: Secondary | ICD-10-CM

## 2021-10-24 ENCOUNTER — Other Ambulatory Visit: Payer: Self-pay | Admitting: Cardiology

## 2021-11-04 ENCOUNTER — Encounter: Payer: Self-pay | Admitting: Neurology

## 2021-11-04 ENCOUNTER — Ambulatory Visit (INDEPENDENT_AMBULATORY_CARE_PROVIDER_SITE_OTHER): Payer: Medicare Other | Admitting: Neurology

## 2021-11-04 VITALS — BP 116/80 | HR 76 | Ht 67.0 in | Wt 169.2 lb

## 2021-11-04 DIAGNOSIS — F0280 Dementia in other diseases classified elsewhere without behavioral disturbance: Secondary | ICD-10-CM | POA: Diagnosis not present

## 2021-11-04 DIAGNOSIS — Z818 Family history of other mental and behavioral disorders: Secondary | ICD-10-CM

## 2021-11-04 DIAGNOSIS — G4731 Primary central sleep apnea: Secondary | ICD-10-CM

## 2021-11-04 DIAGNOSIS — G4733 Obstructive sleep apnea (adult) (pediatric): Secondary | ICD-10-CM | POA: Diagnosis not present

## 2021-11-04 DIAGNOSIS — F0150 Vascular dementia without behavioral disturbance: Secondary | ICD-10-CM | POA: Diagnosis not present

## 2021-11-04 DIAGNOSIS — G309 Alzheimer's disease, unspecified: Secondary | ICD-10-CM | POA: Diagnosis not present

## 2021-11-04 DIAGNOSIS — Z789 Other specified health status: Secondary | ICD-10-CM

## 2021-11-04 MED ORDER — MEMANTINE HCL 10 MG PO TABS: 5.0000 mg | ORAL_TABLET | Freq: Every day | ORAL | 3 refills | Status: DC

## 2021-11-04 NOTE — Patient Instructions (Addendum)
It was nice to see you both today.   You have complaints of memory loss: memory loss or changes in cognitive function can have many reasons and does not always mean you have dementia. Conditions that can contribute to subjective or objective memory loss include: depression, stress, poor sleep from insomnia or sleep apnea, dehydration, fluctuation in blood sugar values, thyroid or electrolyte dysfunction and certain vitamin deficiencies. Dementia can be caused by stroke, brain atherosclerosis or brain vascular disease due to vascular risk factors (smoking, high blood pressure, high cholesterol, obesity and uncontrolled diabetes), certain degenerative brain disorders (including Parkinson's disease and Multiple sclerosis) and by Alzheimer's disease or other, more rare and sometimes hereditary causes. We will do some additional testing:  A memory questionnaire today and we will do a brain scan.  We will call you to schedule this and also with the results.  This test requires insurance authorization.  Since you had side effects on donepezil, I recommend we start you on a different medication called Namenda (generic name: Memantine), starting at 5 mg once daily with gradual buildup to 10 mg twice daily. Please note that side effects may include, but are not limited to: nausea, confusion, hallucination, personality changes. If you are having mild side effects, try to stick with the treatment as these initial side effects may go away after the first 10-14 days.     Please follow-up in about 3 months to see one of our nurse practitioners, we will increase your memory medication at the time if you are able to tolerate it.  Please call us or email [place] through [facility] with any interim questions or concerns.

## 2021-11-04 NOTE — Progress Notes (Signed)
Subjective:    Patient ID: Edward Conway is a 81 y.o. male.  HPI    Star Age, MD, PhD [facility] [address] [place], [place] [address]  Dear Dr. Shelia Media,   I saw your patient, Edward Conway, upon your kind request, in my neurologic clinic today for initial consultation of his memory loss. The patient is accompanied by his daughter, [name], today .As you know, Mr. [name] is an 81 year-old right-handed gentleman with an underlying medical history of hypertension, hyperlipidemia, complex sleep apnea, hypothyroidism, history of prostate cancer, ED, and borderline overweight state, who reports a little about his ongoing symptoms and history, history is primarily provided by his daughter.  She reports a several year history of forgetfulness, repeating himself, and difficulty navigating while driving.  He has not driven a car since summer 2022 because he was getting lost.  He stopped using the donepezil some 6 months ago because of side effects including dizziness, nausea and appetite loss.  His appetite improved after he stopped the donepezil. He does report a family history of.  He lived to be around 43.  Mom lived to be around 1, she did not have any memory loss.  He had 3 brothers and 1 sister, sister had some memory loss, his siblings have passed away.  His wife also passed away a few years ago.  He lives alone, daughter lives close by, son lives in [place].  Patient does not cook, he is able to heat up food.  He would not want to restart using his BiPAP, he had difficulty tolerating it and would forget to use it.  He has not used it in several months or over 1 year.  He has not fallen recently.  He did fall last year and was checked out in the ER per daughter.  He does not have much in the way of physical activity.  He has a dog in the house, but the dog has mobility issues and the does not typically walk the dog.  He does not always drink  enough water, he likes to drink soda, about 2 cans/day, no coffee, rare alcohol, non-smoker.  His daughter has noticed in the past few months repetitive vocalizations and movements almost akin to vocal tics and repetitive motor movements.  He has not had any obvious behavioral or mood related disturbances.  I reviewed your office note from 08/15/2021.  He had blood work through your office on 08/12/2021 and I was able to review the results: CMP showed sodium of 141, potassium 4.4, BUN 10, creatinine 0.95, liver enzymes benign, TSH was 0.78, CBC with platelets and differential showed a borderline elevated hematocrit at 47.5, otherwise normal findings, urinalysis was negative.  Patient was on donepezil 10 mg daily at the time.  He saw Dr. Gasper Sells with [facility] neurology on 04/22/2021 for initial consultation for his memory loss and I was able to review with that note.  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  He was encouraged to continue with his PAP therapy.  He had a brain MRI without contrast on 06/26/2019 and I reviewed the results: IMPRESSION: 1.  No acute intracranial abnormality. 2. Signal changes in the cerebral white matter and left caudate compatible with chronic small vessel disease, relatively mild for age.   He had a head CT without contrast on 06/26/2019 and I reviewed the results:  IMPRESSION: No acute intracranial process.      I have seen him on 2 occasions for his sleep apnea, with a primary central component. He was not compliant with his BiPAP ST.   The patient's allergies, current medications, family history, past medical history, past social history, past surgical history and problem list were reviewed and updated as appropriate.    Previously:    09/10/20: 81 year old right-handed gentleman with an underlying medical history of hypertension, hyperlipidemia,  hypothyroidism and overweight state, who presents for Follow-up consultation of his central sleep apnea after interim testing and starting BiPAP ST.  Patient is accompanied by his daughter again today.  I first met him at the request of his primary care physician on 02/28/2020, at which time he reported snoring and daytime somnolence as well as memory issues.  He was advised to proceed with sleep testing.  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  He was therefore advised to return for a titration study.  He had a titration study on 05/16/2020 but had significant difficulty initiating and maintaining sleep with a sleep efficiency markedly reduced at 34.5%.  REM sleep was mildly delayed at 140 minutes, REM percentage reduced at 15.4%.  He was titrated on CPAP initially from 6 to 12 cm but had increase in central apneas.  He had difficulty tolerating standard BiPAP therapy and had significant difficulty maintaining sleep, ongoing central apneas.  He was therefore switched to BiPAP ST with a backup rate of 12/min.  Based on the limited test results I suggested home BiPAP ST therapy at 16/11 centimeters.   I reviewed his BiPAP compliance data from the past 91 days from 06/11/2020 through 09/09/2020, during which time he used his machine only 5 days with percent use days greater than 4 hours at 0%.  Residual AHI highly elevated at 41.4/h based on the limited data, pressure of 16/11 with a rate of 12.  He reports trying to remember to use his BiPAP but has had trouble tolerating the pressure.  He has also had difficulty remembering to use it.  He is willing to try to do better and motivated to continue with treatment.  His daughter is also concerned that he may not be able to tolerate the mask as well.  He is using a full facemask.  He would be willing to seek a mask refit appointment with his DME provider and try a lower  pressure.  He is still taking naps during the day and would also be willing to try to use his machine during his naps.  He had a checkup with his cardiologist which per patient was good, also a recent checkup with his primary care physician including blood work which per daughter were fine.   02/28/20: (He) reports snoring and excessive daytime somnolence.  He has been more forgetful.  I reviewed your office note from 02/07/2020.  His Epworth sleepiness score is 9 out of 24, which may be an underestimation of his daytime somnolence as he does take MRI every day, sometimes 2+ hours.  He is widowed and lives alone, his daughter sees him frequently.  He has a son in [place].  The patient has had some forgetfulness and this includes forgetting medications, forgetting dates or events.  He drives, he has not had any trouble driving.  He is retired as a Forensic psychologist for some gentle.  He is a non-smoker and drinks alcohol occasionally, drinks caffeine in the form of soda, about 2 servings per day, has been in the process of reducing his soda intake per daughter.  She has also encouraged more water intake, he averages about 2 8 ounce servings of water per day by his estimate.  Bedtime is around 11 and rise time between 7 and 8.  He has significant nocturia about 3-4 times per average night, denies recurrent morning headaches, denies a family history of OSA and has not had a tonsillectomy.  He had a recent visit with his cardiologist and was encouraged to consider sleep apnea evaluation.  He had a sleep study some 5 or 6 years ago and CPAP therapy was not recommended at the time.  He has had some blood pressure fluctuations.  His amlodipine was increased in the recent past to 10 mg. In the recent 6 to 12 months he has been noted to have parasomnias particularly yelling out in his sleep.  This has been witnessed by his granddaughter.  His Past Medical History Is Significant For: Past Medical History:  Diagnosis Date    Hyperlipidemia    Hypertension    OSA on CPAP     His Past Surgical History Is Significant For: Past Surgical History:  Procedure Laterality Date   THYROID SURGERY      His Family History Is Significant For: Family History  Problem Relation Age of Onset   Colon cancer Mother    Healthy Father    Alzheimer's disease Father    Hypertension Brother     His Social History Is Significant For: Social History   Socioeconomic History   Marital status: Widowed    Spouse name: Not on file   Number of children: 2   Years of education: Not on file   Highest education level: Not on file  Occupational History   Not on file  Tobacco Use   Smoking status: Never   Smokeless tobacco: Never  Vaping Use   Vaping Use: Never used  Substance and Sexual Activity   Alcohol use: Yes    Alcohol/week: 2.0 standard drinks    Types: 2 Cans of beer per week    Comment: occ   Drug use: No   Sexual activity: Not on file  Other Topics Concern   Not on file  Social History Narrative   Not on file   Social Determinants of Health   Financial Resource Strain: Not on file  Food Insecurity: Not on file  Transportation Needs: Not on file  Physical Activity: Not on file  Stress: Not on file  Social Connections: Not on file    His Allergies Are:  No Known Allergies:   His Current Medications Are:  Outpatient Encounter Medications as of 11/04/2021  Medication Sig   cholecalciferol (VITAMIN D3) 25 MCG (1000 UNIT) tablet 1 tablet   ezetimibe (ZETIA) 10 MG tablet TAKE 1 TABLET BY MOUTH ONCE A DAY   hydrochlorothiazide (MICROZIDE) 12.5 MG capsule TAKE 1 CAPSULE (12.5 MG TOTAL) BY MOUTH IN THE MORNING.   levothyroxine (SYNTHROID) 88 MCG tablet Take 88 mcg by mouth daily before breakfast.   metoprolol succinate (TOPROL-XL) 50 MG 24 hr tablet TAKE 1 TABLET BY MOUTH DAILY. TAKE WITH OR IMMEDIATELY FOLLOWING A MEAL.   olmesartan (BENICAR) 40 MG tablet Take 1 tablet (40 mg total) by mouth daily.    rosuvastatin (CRESTOR) 20 MG tablet TAKE 1 TABLET BY MOUTH DAILY.   tamsulosin (FLOMAX) 0.4 MG  CAPS capsule Take 0.4 mg by mouth daily.   vitamin B-12 (CYANOCOBALAMIN) 1000 MCG tablet 1 tablet   donepezil (ARICEPT) 10 MG tablet Take by mouth. (Patient not taking: Reported on 11/04/2021)   No facility-administered encounter medications on file as of 11/04/2021.  :   Review of Systems:  Out of a complete 14 point review of systems, all are reviewed and negative with the exception of these symptoms as listed below:   Review of Systems  Neurological:        Pt is here for another neurologist . Pt and daughter states that they saw you for a  sleep consult did have a CPAP machine but  didn't work out because pt  forgot to wear at night . Daughter states pt has been diagnosed with Alzheimer's.      Objective:  Neurological Exam  Physical Exam Physical Examination:   Vitals:   11/04/21 1458  BP: 116/80  Pulse: 76    General Examination: The patient is a very pleasant 81 y.o. male in no acute distress. He appears well-developed and well-nourished and well groomed.   HEENT: Normocephalic, atraumatic, pupils are equal, round and reactive to light, extraocular tracking is good without limitation to gaze excursion or nystagmus noted. Hearing is grossly intact. Face is symmetric with normal facial animation. Rosacea type changes noted in the face/nose area.  Well-healed scar right parietal scalp. Speech is clear, scan, with no dysarthria noted. There is no hypophonia. There is no lip, neck/head, jaw or voice tremor. Neck is supple with FROM. Oropharynx exam reveals: mild mouth dryness, adequate dental hygiene and mild airway crowding. Tongue protrudes centrally and palate elevates symmetrically.     Chest: Clear to auscultation without wheezing, rhonchi or crackles noted.   Heart: S1+S2+0, regular and normal without murmurs, rubs or gallops noted.    Abdomen: Soft, non-tender and  non-distended.   Extremities: There is no obvious edema.      Skin: Warm and dry without trophic changes noted.    Musculoskeletal: exam reveals no obvious joint deformities.    Neurologically:  Mental status: The patient is awake, alert and oriented in all 4 spheres. His immediate and remote memory, attention, language skills and fund of knowledge are impaired. Patient is not very wordy and does not provide his history. Daughter provides history. Mood is normal and affect is blunted. MMSE - Mini Mental State Exam 11/04/2021  Orientation to time 0  Orientation to Place 4  Registration 3  Attention/ Calculation 2  Recall 0  Language- name 2 objects 2  Language- repeat 1  Language- follow 3 step command 3  Language- read & follow direction 1  Write a sentence 1  Copy design 1  Total score 18   On 11/04/2021: CDT: 4/4, AFT: 4/min.   Cranial nerves II - XII are as described above under HEENT exam.  Motor exam: Normal bulk, strength and tone is noted. There is no tremor. Fine motor skills and coordination: grossly intact.  Cerebellar testing: No dysmetria or intention tremor.  Sensory exam: intact to light touch in the upper and lower extremities.  He stands without difficulty, posture is age-appropriate, he walks without a walking aid, no obvious limp or shuffling, preserved arm swing.      Assessment and Plan:  In summary, PARRISH BONN is a very pleasant 81 year-old right-handed gentleman with an underlying medical history of hypertension, hyperlipidemia, complex sleep apnea, hypothyroidism, history of prostate cancer, ED, and borderline overweight state, who  presents for evaluation of his memory loss of several years duration.  His family history supports dementia and he may be at risk for Alzheimer's dementia, he also has vascular risk factors, he may have overlap or mixed type of dementia.  He has not been able to use his BiPAP for his primary central sleep apnea unfortunately.   He is not ready to embark on another trial of BiPAP therapy.  His daughter reports that he would just simply forgets to use it and had difficulty tolerating it when he was on it. Home sleep testing on 04/08/2020 indicated primary central sleep apnea with a central AHI of 46/h, total AHI of 64.7/h, O2 nadir of 82%.  He had a difficult and challenging titration study on 05/16/2020, had significant difficulty maintaining sleep.  He had seen [facility] neurology and started a trial of donepezil 2022 but could not tolerate it secondary to nausea, appetite loss and dizziness.  She would like for his neurologic care to be closer to home.  No longer drives, I agree that he should not start any longer. He has moderately abnormal memory scores today.  We talked about the importance of maintaining a healthy lifestyle, good nutrition, regular exercise, good hydration with water.  I suggested we start him on Namenda low-dose 10 mg strength half a pill nightly for now.  We will gradually increase to make sure he tolerates the medication.  He was given written instructions.  I would like to proceed with a brain MRI without contrast to compare with findings from 2020.  His brain MRI without contrast from September 2020 showed mild microvascular changes and no telltale atrophy.  His daughter has noticed some repetitive vocalizations of movements.  We will continue to monitor for behavioral changes, no obvious depression at this time, no report of behavioral escalations.  He is advised to follow-up in this office in 3 months to see one of our nurse practitioners, we will try to increase the memantine to 5 mg twice daily at the time with a goal to gradually increase to 10 mg twice daily eventually.  We will call in the interim with his brain MRI results.  I answered all their questions today and the patient and his daughter were in agreement.  Thank you very much for allowing me to participate in the care of this nice patient. If I  can be of any further assistance to you please do not hesitate to call me at [phone].  Sincerely,   Star Age, MD, PhD  I spent 45 minutes in total face-to-face time and in reviewing records during pre-charting, more than 50% of which was spent in counseling and coordination of care, reviewing test results, reviewing medications and treatment regimen and/or in discussing or reviewing the diagnosis of dementia, the prognosis and treatment options. Pertinent laboratory and imaging test results that were available during this visit with the patient were reviewed by me and considered in my medical decision making (see chart for details).

## 2021-11-11 ENCOUNTER — Ambulatory Visit: Payer: Medicare Other | Admitting: Neurology

## 2021-11-15 ENCOUNTER — Other Ambulatory Visit: Payer: Medicare Other

## 2021-11-16 ENCOUNTER — Other Ambulatory Visit: Payer: Self-pay

## 2021-11-16 ENCOUNTER — Ambulatory Visit
Admission: RE | Admit: 2021-11-16 | Discharge: 2021-11-16 | Disposition: A | Discharge status: Home / Self Care | Payer: Medicare Other | Source: Ambulatory Visit | Attending: Neurology | Admitting: Neurology

## 2021-11-16 DIAGNOSIS — G4731 Primary central sleep apnea: Secondary | ICD-10-CM

## 2021-11-16 DIAGNOSIS — F0150 Vascular dementia without behavioral disturbance: Secondary | ICD-10-CM

## 2021-11-16 DIAGNOSIS — F0280 Dementia in other diseases classified elsewhere without behavioral disturbance: Secondary | ICD-10-CM

## 2021-11-16 DIAGNOSIS — G4733 Obstructive sleep apnea (adult) (pediatric): Secondary | ICD-10-CM

## 2021-11-16 DIAGNOSIS — G309 Alzheimer's disease, unspecified: Secondary | ICD-10-CM | POA: Diagnosis not present

## 2021-11-16 DIAGNOSIS — Z789 Other specified health status: Secondary | ICD-10-CM

## 2021-11-16 DIAGNOSIS — Z818 Family history of other mental and behavioral disorders: Secondary | ICD-10-CM

## 2022-02-02 ENCOUNTER — Other Ambulatory Visit: Payer: Self-pay | Admitting: Cardiology

## 2022-02-03 ENCOUNTER — Encounter: Payer: Self-pay | Admitting: Adult Health

## 2022-02-03 ENCOUNTER — Ambulatory Visit (INDEPENDENT_AMBULATORY_CARE_PROVIDER_SITE_OTHER): Payer: Medicare Other | Admitting: Adult Health

## 2022-02-03 VITALS — BP 136/96 | HR 77 | Ht 67.0 in | Wt 169.0 lb

## 2022-02-03 DIAGNOSIS — G4731 Primary central sleep apnea: Secondary | ICD-10-CM | POA: Diagnosis not present

## 2022-02-03 DIAGNOSIS — G309 Alzheimer's disease, unspecified: Secondary | ICD-10-CM | POA: Diagnosis not present

## 2022-02-03 DIAGNOSIS — Z789 Other specified health status: Secondary | ICD-10-CM

## 2022-02-03 DIAGNOSIS — F0150 Vascular dementia without behavioral disturbance: Secondary | ICD-10-CM

## 2022-02-03 DIAGNOSIS — G4733 Obstructive sleep apnea (adult) (pediatric): Secondary | ICD-10-CM | POA: Diagnosis not present

## 2022-02-03 DIAGNOSIS — Z818 Family history of other mental and behavioral disorders: Secondary | ICD-10-CM

## 2022-02-03 DIAGNOSIS — R1310 Dysphagia, unspecified: Secondary | ICD-10-CM

## 2022-02-03 DIAGNOSIS — F0280 Dementia in other diseases classified elsewhere without behavioral disturbance: Secondary | ICD-10-CM

## 2022-02-03 MED ORDER — MEMANTINE HCL 10 MG PO TABS
ORAL_TABLET | ORAL | 3 refills | Status: DC
Start: 2022-02-03 — End: 2022-06-09

## 2022-02-03 NOTE — Progress Notes (Signed)
?[facility] ?[id number] ?[place] [address] ?[phone] ? ?     OFFICE FOLLOW UP NOTE ? ?[name] ?Date of Birth:  July 12, 1941 ?Medical Record Number:  [id number]  ? ?Reason for visit: Dementia follow-up ? ? ? ?SUBJECTIVE: ? ? ?CHIEF COMPLAINT:  ?Chief Complaint  ?Patient presents with  ? Follow-up  ?  RM 3 with daughter Edward Conway ?Pt is well and stable per daughter, no new concerns   ? ? ?HPI:  ? ?Update 02/03/2022 JM: Returns for 11-month follow-up visit regarding memory loss accompanied by his daughter who provides majority of history.  Since prior visit, cognition has been stable. Has continued on Namenda 5mg  nightly - denies side effects. Was previously intolerant to Aricept. He continues to live alone but does have daughter checking on him frequently and assists with IADLs.  Able to maintain ADLs independently. At times, will have difficulty swallowing food and at times will cough - was seen by GI Sept/Nov 2022 and had esophagus stretching but symptoms have persisted. Daughter also has noticed he may feel overwhelmed and will "shut down" if sees too much food on table/plate.  Daughter sent MyChart message prior to today's visit regarding behaviors consisting of repetitive hand movements, tapping, vocalizations (beeping noises) and repetitive words.  These were also discussed at prior visit with Dr. Dec 2022. She believes these have been worsening. Does endorse underlying anxiety which has been chronic. Will feel overwhelmed if in small crowded area but does enjoy going to the grocery store and getting out of the house accompanied by his daughter.  No new concerns at this time. ? ? ? ? ?Consult visit 11/04/2021 Dr. 11/06/2021 (provided for reference purposes only) ?[name] is an 81 year-old right-handed gentleman with an underlying medical history of hypertension, hyperlipidemia, complex sleep apnea, hypothyroidism, history of prostate cancer, ED, and borderline overweight  state, who reports a little about his ongoing symptoms and history, history is primarily provided by his daughter.  She reports a several year history of forgetfulness, repeating himself, and difficulty navigating while driving.  He has not driven a car since summer 2022 because he was getting lost.  He stopped using the donepezil some 6 months ago because of side effects including dizziness, nausea and appetite loss.  His appetite improved after he stopped the donepezil. ?He does report a family history of.  He lived to be around 78.  Mom lived to be around 72, she did not have any memory loss.  He had 3 brothers and 1 sister, sister had some memory loss, his siblings have passed away.  His wife also passed away a few years ago.  He lives alone, daughter lives close by, son lives in [place].  Patient does not cook, he is able to heat up food.  He would not want to restart using his BiPAP, he had difficulty tolerating it and would forget to use it.  He has not used it in several months or over 1 year.  He has not fallen recently.  He did fall last year and was checked out in the ER per daughter.  He does not have much in the way of physical activity.  He has a dog in the house, but the dog has mobility issues and the does not typically walk the dog.  He does not always drink enough water, he likes to drink soda, about 2 cans/day, no coffee, rare alcohol, non-smoker.  His daughter has noticed in the past few months  repetitive vocalizations and movements almost akin to vocal tics and repetitive motor movements.  He has not had any obvious behavioral or mood related disturbances. ?  ?I reviewed your office note from 08/15/2021.  He had blood work through your office on 08/12/2021 and I was able to review the results: CMP showed sodium of 141, potassium 4.4, BUN 10, creatinine 0.95, liver enzymes benign, TSH was 0.78, CBC with platelets and differential showed a borderline elevated hematocrit at 47.5, otherwise normal  findings, urinalysis was negative.  Patient was on donepezil 10 mg daily at the time. ?  ?He [facility] Jyl [facility]inz[facility]jamin [facility] on 04/22/2021 for initial consultation for his memory loss and I was able to review with that note.  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  He was encouraged to continue with his PAP therapy. ?  ?He had a brain MRI without contrast on 06/26/2019 and I reviewed the results: IMPRESSION: ?1.  No acute intracranial abnormality. ?2. Signal changes in the cerebral white matter and left caudate ?compatible with chronic small vessel disease, relatively mild for ?age. ?  ?He had a head CT without contrast on 06/26/2019 and I reviewed the results: IMPRESSION: ?No acute intracranial process. ?  ?  ?  ?I have seen him on 2 occasions for his sleep apnea, with a primary central component. He was not compliant with his BiPAP ST.  ?  ?The patient's allergies, current medications, family history, past medical history, past social history, past surgical history and problem list were reviewed and updated as appropriate.  ? ? ? ? ?ROS:   ?14 system review of systems performed and negative with exception of those listed in HPI ? ?PMH:  ?Past Medical History:  ?Diagnosis Date  ? Hyperlipidemia   ? Hypertension   ? OSA on CPAP   ? ? ?PSH:  ?Past Surgical History:  ?Procedure Laterality Date  ? THYROID SURGERY    ? ? ?Social History:  ?Social History  ? ?Socioeconomic History  ? Marital status: Widowed  ?  Spouse name: Not on file  ? Number of children: 2  ? Years of education: Not on file  ? Highest education level: Not on file  ?Occupational History  ? Not on file  ?Tobacco Use  ? Smoking status: Never  ? Smokeless tobacco: Never  ?Vaping Use  ? Vaping Use: Never used  ?Substance and Sexual Activity  ? Alcohol use: Yes  ?  Alcohol/week: 2.0 standard drinks  ?  Types: 2 Cans of beer per week  ?   Comment: occ  ? Drug use: No  ? Sexual activity: Not on file  ?Other Topics Concern  ? Not on file  ?Social History Narrative  ? Not on file  ? ?Social Determinants of Health  ? ?Financial Resource Strain: Not on file  ?Food Insecurity: Not on file  ?Transportation Needs: Not on file  ?Physical Activity: Not on file  ?Stress: Not on file  ?Social Connections: Not on file  ?Intimate Partner Violence: Not on file  ? ? ?Family History:  ?Family History  ?Problem Relation Age of Onset  ? Colon cancer Mother   ? Healthy Father   ? Alzheimer's disease Father   ? Hypertension Brother   ? ? ?Medications:   ?Current Outpatient Medications on File Prior to Visit  ?Medication Sig Dispense Refill  ? cholecalciferol (VITAMIN D3) 25 MCG (1000  UNIT) tablet 1 tablet    ? ezetimibe (ZETIA) 10 MG tablet TAKE 1 TABLET BY MOUTH ONCE A DAY 30 tablet 0  ? hydrochlorothiazide (MICROZIDE) 12.5 MG capsule TAKE 1 CAPSULE (12.5 MG TOTAL) BY MOUTH IN THE MORNING. 90 capsule 3  ? levothyroxine (SYNTHROID) 88 MCG tablet Take 88 mcg by mouth daily before breakfast.    ? memantine (NAMENDA) 10 MG tablet Take 0.5 tablets (5 mg total) by mouth at bedtime. 30 tablet 3  ? metoprolol succinate (TOPROL-XL) 50 MG 24 hr tablet TAKE 1 TABLET BY MOUTH DAILY. TAKE WITH OR IMMEDIATELY FOLLOWING A MEAL. 30 tablet 2  ? olmesartan (BENICAR) 40 MG tablet Take 1 tablet (40 mg total) by mouth daily. 30 tablet 0  ? rosuvastatin (CRESTOR) 20 MG tablet TAKE 1 TABLET BY MOUTH DAILY. 90 tablet 3  ? tamsulosin (FLOMAX) 0.4 MG CAPS capsule Take 0.4 mg by mouth daily.    ? vitamin B-12 (CYANOCOBALAMIN) 1000 MCG tablet 1 tablet    ? ?No current facility-administered medications on file prior to visit.  ? ? ?Allergies:  No Known Allergies ? ? ? ?OBJECTIVE: ? ?Physical Exam ? ?Vitals:  ? 02/03/22 1446  ?BP: (!) 136/96  ?Pulse: 77  ?Weight: 169 lb (76.7 kg)  ?Height: 5\' 7"  (1.702 m)  ? ?Body mass index is 26.47 kg/m? ?No results found. ? ?General: well developed, well  nourished, very pleasant elderly Caucasian male, seated, in no evident distress ?Head: head normocephalic and atraumatic.   ?Neck: supple with no carotid or supraclavicular bruits ?Cardiovascular: regular rate and rhyth

## 2022-02-03 NOTE — Patient Instructions (Addendum)
Your Plan: ? ?Increase Namenda to 5mg  AM and 5mg  PM for 7 days then increase to 5mg  AM and 10mg  PM for 7 days then increase to 10mg  twice daily - if any difficulties during this time with increased dosages and any issues tolerating, please let me know ? ?You will be called to schedule a swallowing study to get more information with your swallowing difficulties  ? ? ?Follow up in 4 months or call earlier if needed ? ? ? ?Thank you for coming to see at [facility]. I hope we have been able to provide you high quality care today. ? ?You may receive a patient satisfaction survey over the next few weeks. We would appreciate your feedback and comments so that we may continue to improve ourselves and the health of our patients. ? ?

## 2022-02-04 ENCOUNTER — Other Ambulatory Visit (HOSPITAL_COMMUNITY): Payer: Self-pay

## 2022-02-04 DIAGNOSIS — R059 Cough, unspecified: Secondary | ICD-10-CM

## 2022-02-04 DIAGNOSIS — R1310 Dysphagia, unspecified: Secondary | ICD-10-CM

## 2022-02-24 ENCOUNTER — Ambulatory Visit (HOSPITAL_COMMUNITY)
Admission: RE | Admit: 2022-02-24 | Discharge: 2022-02-24 | Disposition: A | Discharge status: Home / Self Care | Payer: Medicare Other | Source: Ambulatory Visit | Attending: Adult Health | Admitting: Adult Health

## 2022-02-24 DIAGNOSIS — R059 Cough, unspecified: Secondary | ICD-10-CM | POA: Insufficient documentation

## 2022-02-24 DIAGNOSIS — R1310 Dysphagia, unspecified: Secondary | ICD-10-CM | POA: Insufficient documentation

## 2022-02-24 NOTE — Progress Notes (Signed)
Modified Barium Swallow Progress Note ? ?Patient Details  ?Name: Edward Conway ?MRN: [id number] ?Date of Birth: [birth year] ? ?Today's Date: 02/24/2022 ? ?Modified Barium Swallow completed.  Full report located under Chart Review in the Imaging Section. ? ?Brief recommendations include the following: ? ?Clinical Impression ? Pt's oropharyngeal swallow is WFL, demonstrating good efficiency and airway protection. Note that there was some barium observed to remain in the esophagus upon completion of the study, and given subjective report of solids feeling stuck and increased use of Tums recently, could consider a possible esophageal etiology. MD could consider a more dedicated test of the esophagus, although for today, handout was provided with education shared about esophageal precautions. Would continue to eat regular solids and thin liquids as tolerated. ?  ?Swallow Evaluation Recommendations ? ? Recommended Consults: Consider GI evaluation;Consider esophageal assessment ? ? SLP Diet Recommendations: Regular solids;Thin liquid ? ? Liquid Administration via: Cup;Straw ? ? Medication Administration: Whole meds with liquid ? ? Supervision: Patient able to self feed ? ? Compensations: Minimize environmental distractions;Slow rate;Small sips/bites;Follow solids with liquid ? ? Postural Changes: Seated upright at 90 degrees;Remain semi-upright after after feeds/meals (Comment) ? ? Oral Care Recommendations: Oral care BID ? ?   ? ? ? ?Mahala Menghini., M.A. CCC-SLP ?Acute Rehabilitation Services ?Office [phone] ? ?Secure chat preferred ? ?02/24/2022,1:24 PM ?

## 2022-02-25 ENCOUNTER — Telehealth: Payer: Self-pay | Admitting: *Deleted

## 2022-02-25 NOTE — Telephone Encounter (Signed)
-----   Message from Huston Foley, MD sent at 02/25/2022  9:04 AM EDT ----- ?Please call patient or caretaker on DPR read regarding his swallow study.  It did not show any obvious problems with swallowing or evidence of aspiration.  No significant changes with food consistency or liquids was recommended.  He can continue to eat regular solids and thin liquids as tolerated.  No speech therapy treatment was recommended.  Nevertheless, I would recommend that he try to take precautions, such as siting upright when eating, eating smaller bites, drinking sips of water in between bites to clear his throat. ?

## 2022-02-25 NOTE — Telephone Encounter (Signed)
Spoke with patient's daughter and discussed the patient's swallow study results.  She is aware the test did not show any obvious problems with swallowing or evidence of aspiration.  No significant changes with food consistency or liquids was recommended.  Patient can continue to eat regular solids and thin liquids as tolerated.  Speech therapy was not recommended. Nevertheless, Dr. Frances Furbish does recommend that the patient try to take precautions, such as sitting upright when eating, eating smaller bites, drinking sips of water in between bites to clear his throat.  The patient's daughter verbalized understanding and appreciation for the call.  She states she will call back if any questions arise.  She will relay the results to the patient. ?

## 2022-06-08 NOTE — Progress Notes (Unsigned)
[facility] [address] [place]. [place] [address] [phone]       OFFICE FOLLOW UP NOTE  Edward Conway Date of Birth:  [birth year] Medical Record Number:  [id number]   Primary neurologist: Dr. Frances Furbish Reason for visit: Dementia follow-up    SUBJECTIVE:   CHIEF COMPLAINT:  No chief complaint on file.   HPI:    Update 06/09/2022 JM: Patient returns for 4 months cognitive follow-up.  Accompanied by his daughter who provides majority of history.  Increased Namenda dosage with gradual titration to 10 mg twice daily.  Completed MBS for swallowing concerns which did not show any significant concerns of swallowing or evidence of aspiration.        History provided for reference purposes only Update 02/03/2022 JM: Returns for 53-month follow-up visit regarding memory loss accompanied by his daughter who provides majority of history.  Since prior visit, cognition has been stable. Has continued on Namenda 5mg  nightly - denies side effects. Was previously intolerant to Aricept. He continues to live alone but does have daughter checking on him frequently and assists with IADLs.  Able to maintain ADLs independently. At times, will have difficulty swallowing food and at times will cough - was seen by GI Sept/Nov 2022 and had esophagus stretching but symptoms have persisted. Daughter also has noticed he may feel overwhelmed and will "shut down" if sees too much food on table/plate.  Daughter sent MyChart message prior to today's visit regarding behaviors consisting of repetitive hand movements, tapping, vocalizations (beeping noises) and repetitive words.  These were also discussed at prior visit with Dr. Dec 2022. She believes these have been worsening. Does endorse underlying anxiety which has been chronic. Will feel overwhelmed if in small crowded area but does enjoy going to the grocery store and getting out of the house accompanied by his daughter.  No new concerns at  this time.  Consult visit 11/04/2021 Dr. 11/06/2021 (provided for reference purposes only) Edward Conway is an 81 year-old right-handed gentleman with an underlying medical history of hypertension, hyperlipidemia, complex sleep apnea, hypothyroidism, history of prostate cancer, ED, and borderline overweight state, who reports a little about his ongoing symptoms and history, history is primarily provided by his daughter.  She reports a several year history of forgetfulness, repeating himself, and difficulty navigating while driving.  He has not driven a car since summer 2022 because he was getting lost.  He stopped using the donepezil some 6 months ago because of side effects including dizziness, nausea and appetite loss.  His appetite improved after he stopped the donepezil. He does report a family history of.  He lived to be around 78.  Mom lived to be around 61, she did not have any memory loss.  He had 3 brothers and 1 sister, sister had some memory loss, his siblings have passed away.  His wife also passed away a few years ago.  He lives alone, daughter lives close by, son lives in [place].  Patient does not cook, he is able to heat up food.  He would not want to restart using his BiPAP, he had difficulty tolerating it and would forget to use it.  He has not used it in several months or over 1 year.  He has not fallen recently.  He did fall last year and was checked out in the ER per daughter.  He does not have much in the way of physical activity.  He has a dog in the house, but the dog  has mobility issues and the does not typically walk the dog.  He does not always drink enough water, he likes to drink soda, about 2 cans/day, no coffee, rare alcohol, non-smoker.  His daughter has noticed in the past few months repetitive vocalizations and movements almost akin to vocal tics and repetitive motor movements.  He has not had any obvious behavioral or mood related disturbances.   I reviewed your office note from  08/15/2021.  He had blood work through your office on 08/12/2021 and I was able to review the results: CMP showed sodium of 141, potassium 4.4, BUN 10, creatinine 0.95, liver enzymes benign, TSH was 0.78, CBC with platelets and differential showed a borderline elevated hematocrit at 47.5, otherwise normal findings, urinalysis was negative.  Patient was on donepezil 10 mg daily at the time.   He saw Dr. Jyl Heinz with [facility] neurology on 04/22/2021 for initial consultation for his memory loss and I was able to review with that note.  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  He was encouraged to continue with his PAP therapy.   He had a brain MRI without contrast on 06/26/2019 and I reviewed the results: IMPRESSION: 1.  No acute intracranial abnormality. 2. Signal changes in the cerebral white matter and left caudate compatible with chronic small vessel disease, relatively mild for age.   He had a head CT without contrast on 06/26/2019 and I reviewed the results: IMPRESSION: No acute intracranial process.       I have seen him on 2 occasions for his sleep apnea, with a primary central component. He was not compliant with his BiPAP ST.    The patient's allergies, current medications, family history, past medical history, past social history, past surgical history and problem list were reviewed and updated as appropriate.      ROS:   14 system review of systems performed and negative with exception of those listed in HPI  PMH:  Past Medical History:  Diagnosis Date   Hyperlipidemia    Hypertension    OSA on CPAP     PSH:  Past Surgical History:  Procedure Laterality Date   THYROID SURGERY      Social History:  Social History   Socioeconomic History   Marital status: Widowed    Spouse name: Not on file   Number of children: 2   Years of education: Not on file   Highest  education level: Not on file  Occupational History   Not on file  Tobacco Use   Smoking status: Never   Smokeless tobacco: Never  Vaping Use   Vaping Use: Never used  Substance and Sexual Activity   Alcohol use: Yes    Alcohol/week: 2.0 standard drinks of alcohol    Types: 2 Cans of beer per week    Comment: occ   Drug use: No   Sexual activity: Not on file  Other Topics Concern   Not on file  Social History Narrative   Not on file   Social Determinants of Health   Financial Resource Strain: Not on file  Food Insecurity: Not on file  Transportation Needs: Not on file  Physical Activity: Not on file  Stress: Not on file  Social Connections: Not on file  Intimate Partner Violence: Not on file    Family History:  Family History  Problem Relation Age of Onset   Colon cancer Mother  Healthy Father    Alzheimer's disease Father    Hypertension Brother     Medications:   Current Outpatient Medications on File Prior to Visit  Medication Sig Dispense Refill   cholecalciferol (VITAMIN D3) 25 MCG (1000 UNIT) tablet 1 tablet     ezetimibe (ZETIA) 10 MG tablet TAKE 1 TABLET BY MOUTH ONCE A DAY 30 tablet 0   hydrochlorothiazide (MICROZIDE) 12.5 MG capsule TAKE 1 CAPSULE (12.5 MG TOTAL) BY MOUTH IN THE MORNING. 90 capsule 3   levothyroxine (SYNTHROID) 88 MCG tablet Take 88 mcg by mouth daily before breakfast.     memantine (NAMENDA) 10 MG tablet Taper/ramp 5mg  twice daily for 7 days; 5mg  AM and 5mg  PM for 7 days; 5mg  AM and 10mg  PM for 7 days; 10mg  twice daily 63 tablet 3   metoprolol succinate (TOPROL-XL) 50 MG 24 hr tablet TAKE 1 TABLET BY MOUTH DAILY. TAKE WITH OR IMMEDIATELY FOLLOWING A MEAL. 30 tablet 2   olmesartan (BENICAR) 40 MG tablet Take 1 tablet (40 mg total) by mouth daily. 30 tablet 0   rosuvastatin (CRESTOR) 20 MG tablet TAKE 1 TABLET BY MOUTH DAILY. 90 tablet 3   tamsulosin (FLOMAX) 0.4 MG CAPS capsule Take 0.4 mg by mouth daily.     vitamin B-12  (CYANOCOBALAMIN) 1000 MCG tablet 1 tablet     No current facility-administered medications on file prior to visit.    Allergies:  No Known Allergies    OBJECTIVE:  Physical Exam  There were no vitals filed for this visit.  There is no height or weight on file to calculate BMI. No results found.  General: well developed, well nourished, very pleasant elderly Caucasian male, seated, in no evident distress Head: head normocephalic and atraumatic.   Neck: supple with no carotid or supraclavicular bruits Cardiovascular: regular rate and rhythm, no murmurs Musculoskeletal: no deformity Skin:  no rash/petichiae Vascular:  Normal pulses all extremities   Neurologic Exam Mental Status: Awake and fully alert.  Fluent speech and language.  Disoriented to place and time. Recent memory impaired and remote memory intact. Attention span, concentration and fund of knowledge impaired. Mood and affect relatively flat with limited participation in conversation unless spoken to.     11/04/2021    3:31 PM  MMSE - Mini Mental State Exam  Orientation to time 0  Orientation to Place 4  Registration 3  Attention/ Calculation 2  Recall 0  Language- name 2 objects 2  Language- repeat 1  Language- follow 3 step command 3  Language- read & follow direction 1  Write a sentence 1  Copy design 1  Total score 18   Cranial Nerves: Pupils equal, briskly reactive to light. Extraocular movements full without nystagmus. Visual fields full to confrontation. Hearing intact. Facial sensation intact. Face, tongue, palate moves normally and symmetrically.  Motor: Normal bulk and tone. Normal strength in all tested extremity muscles Sensory.: intact to touch , pinprick , position and vibratory sensation.  Coordination: Rapid alternating movements normal in all extremities. Finger-to-nose and heel-to-shin performed accurately bilaterally. Gait and Station: Arises from chair without difficulty. Stance is normal.  Gait demonstrates normal stride length and balance without use of AD.  Reflexes: 1+ and symmetric. Toes downgoing.         ASSESSMENT: Edward Conway is a 81 y.o. 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 which has been relatively stable over the past few months but does have some increased repetitive vocalizations and movements.  PLAN:  Mixed dementia: Increase Namenda with slow titration to eventual dose of 10 mg twice daily. Discussed potential benefit with anxiety and repetitive behaviors.  Suspect some underlying anxiety possibly contributing and may need to consider antianxiety medication in the future if needed. Will plan on repeat MMSE at follow up visit Dysphagia: present over the past 6 months. Had esophageal stretching in Sept/Nov 2022.  Symptoms have been persistent since that time.  Recommend completing MBS for further evaluation    Follow up in 4 months or call earlier if needed   CC:  PCP: Merri Brunette, MD    I spent 34 minutes of face-to-face and non-face-to-face time with patient and daughter.  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 Any transcriptional errors that result from this process are unintentional.

## 2022-06-09 ENCOUNTER — Encounter: Payer: Self-pay | Admitting: Adult Health

## 2022-06-09 ENCOUNTER — Ambulatory Visit (INDEPENDENT_AMBULATORY_CARE_PROVIDER_SITE_OTHER): Payer: Medicare Other | Admitting: Adult Health

## 2022-06-09 VITALS — BP 151/94 | HR 81 | Ht 67.0 in | Wt 176.4 lb

## 2022-06-09 DIAGNOSIS — F0150 Vascular dementia without behavioral disturbance: Secondary | ICD-10-CM

## 2022-06-09 DIAGNOSIS — F0280 Dementia in other diseases classified elsewhere without behavioral disturbance: Secondary | ICD-10-CM

## 2022-06-09 DIAGNOSIS — G309 Alzheimer's disease, unspecified: Secondary | ICD-10-CM

## 2022-06-09 MED ORDER — MEMANTINE HCL ER 14 MG PO CP24: 14.0000 mg | ORAL_CAPSULE | Freq: Every day | ORAL | 5 refills | Status: DC

## 2022-06-09 NOTE — Patient Instructions (Addendum)
Your Plan:  Start Namenda XR 14mg  daily, can further increase after a couple weeks if needed - please keep me updated      Follow up in 6 months or call earlier if needed       Thank you for coming to see at [facility]. I hope we have been able to provide you high quality care today.  You may receive a patient satisfaction survey over the next few weeks. We would appreciate your feedback and comments so that we may continue to improve ourselves and the health of our patients.

## 2022-06-30 ENCOUNTER — Other Ambulatory Visit: Payer: Self-pay | Admitting: Cardiology

## 2022-07-27 ENCOUNTER — Other Ambulatory Visit: Payer: Medicare Other

## 2022-08-18 ENCOUNTER — Ambulatory Visit: Payer: Medicare Other

## 2022-08-18 DIAGNOSIS — I10 Essential (primary) hypertension: Secondary | ICD-10-CM

## 2022-08-21 ENCOUNTER — Ambulatory Visit: Payer: Medicare Other | Admitting: Student

## 2022-08-21 ENCOUNTER — Ambulatory Visit: Payer: Medicare Other

## 2022-08-21 VITALS — BP 130/84 | HR 85 | Ht 67.0 in | Wt 176.4 lb

## 2022-08-21 DIAGNOSIS — I10 Essential (primary) hypertension: Secondary | ICD-10-CM

## 2022-08-21 DIAGNOSIS — E782 Mixed hyperlipidemia: Secondary | ICD-10-CM

## 2022-08-21 DIAGNOSIS — G4733 Obstructive sleep apnea (adult) (pediatric): Secondary | ICD-10-CM

## 2022-08-21 MED ORDER — HYDROCHLOROTHIAZIDE 12.5 MG PO CAPS: 12.5000 mg | ORAL_CAPSULE | Freq: Every morning | ORAL | 3 refills | Status: DC

## 2022-08-21 MED ORDER — METOPROLOL SUCCINATE ER 50 MG PO TB24: 50.0000 mg | ORAL_TABLET | Freq: Every day | ORAL | 3 refills | Status: DC

## 2022-08-21 MED ORDER — ROSUVASTATIN CALCIUM 20 MG PO TABS: 20.0000 mg | ORAL_TABLET | Freq: Every day | ORAL | 3 refills | Status: DC

## 2022-08-21 MED ORDER — EZETIMIBE 10 MG PO TABS: 10.0000 mg | ORAL_TABLET | Freq: Every day | ORAL | 3 refills | Status: DC

## 2022-08-21 NOTE — Progress Notes (Signed)
Edward Conway Date of Birth: 17-Aug-1941 MRN: [id number] Primary Care Provider:Pharr, Edward Jew, MD Former Cardiology Providers: Edward Dunn, MSN, APRN, FNP-C Primary Cardiologist: Edward Kras, DO, [facility]  Date: 08/22/21 Last Office Visit: 07/25/2020   Chief Complaint  Patient presents with   Follow-up    1 year   Hyperlipidemia   Hypertension   HPI  Edward Conway is a 81 y.o.  male who presents to the office with a chief complaint of " 1 year follow-up for hypertension management." Patient's past medical history and cardiovascular risk factors include: Hypertension, hyperlipidemia, OSA on CPAP, hypothyroidism, advanced age.  Presents today for annual visit.  He is here today with his daughter. Over the last 1 year patient states that he is doing well from a cardiovascular standpoint.  No hospitalizations or urgent care visits since last office encounter. He denies any chest pain or shortness of breath with rest or exertional activity.  No change in overall physical endurance.  He does have a health aide that comes during the week to help with medications and meals.  FUNCTIONAL STATUS: active with managing his 6 acres at home.  ALLERGIES: No Known Allergies   MEDICATION LIST PRIOR TO VISIT: Current Outpatient Medications on File Prior to Visit  Medication Sig Dispense Refill   cholecalciferol (VITAMIN D3) 25 MCG (1000 UNIT) tablet 1 tablet     levothyroxine (SYNTHROID) 88 MCG tablet Take 88 mcg by mouth daily before breakfast.     memantine (NAMENDA XR) 14 MG CP24 24 hr capsule Take 1 capsule (14 mg total) by mouth daily. 30 capsule 5   nystatin ointment (MYCOSTATIN) Apply 1 Application topically 2 (two) times daily.     olmesartan (BENICAR) 40 MG tablet Take 1 tablet (40 mg total) by mouth daily. 30 tablet 0   tamsulosin (FLOMAX) 0.4 MG CAPS capsule Take 0.4 mg by mouth daily.     vitamin B-12 (CYANOCOBALAMIN) 1000 MCG tablet 1 tablet     No current  facility-administered medications on file prior to visit.    PAST MEDICAL HISTORY: Past Medical History:  Diagnosis Date   Dementia ([place])    Hyperlipidemia    Hypertension    OSA on CPAP     PAST SURGICAL HISTORY: Past Surgical History:  Procedure Laterality Date   THYROID SURGERY      FAMILY HISTORY: The patient's family history includes Alzheimer's disease in his father; Colon cancer in his mother; Healthy in his father; Hypertension in his brother.   SOCIAL HISTORY:  The patient  reports that he has never smoked. He has never used smokeless tobacco. He reports current alcohol use of about 2.0 standard drinks of alcohol per week. He reports that he does not use drugs.  Review of Systems  Constitutional: Negative for chills and fever.  HENT:  Negative for ear discharge, ear pain and nosebleeds.   Eyes:  Negative for blurred vision and discharge.  Cardiovascular:  Negative for chest pain, claudication, dyspnea on exertion, leg swelling, near-syncope, orthopnea, palpitations, paroxysmal nocturnal dyspnea and syncope.  Respiratory:  Positive for snoring. Negative for cough and shortness of breath.   Endocrine: Negative for polydipsia, polyphagia and polyuria.  Hematologic/Lymphatic: Negative for bleeding problem.  Skin:  Negative for flushing and nail changes.  Musculoskeletal:  Negative for muscle cramps, muscle weakness and myalgias.  Gastrointestinal:  Negative for abdominal pain, dysphagia, hematemesis, hematochezia, melena, nausea and vomiting.  Neurological:  Negative for dizziness, focal weakness and light-headedness.    PHYSICAL EXAM:  08/21/2022    2:14 PM 06/09/2022    2:30 PM 02/03/2022    2:46 PM  Vitals with BMI  Height _0  _1  _2   Weight 176 lbs 6 oz 176 lbs 6 oz 169 lbs  BMI 27.62 44.96 75.91  Systolic 638 466 599  Diastolic 84 94 96  Pulse 85 81 77   Physical Exam Cardiovascular:     Rate and Rhythm: Normal rate and regular rhythm.      Pulses:          Carotid pulses are 2+ on the right side and 2+ on the left side.      Radial pulses are 2+ on the right side and 2+ on the left side.       Dorsalis pedis pulses are 2+ on the right side and 2+ on the left side.     Heart sounds: Normal heart sounds. No murmur heard. Pulmonary:     Effort: Pulmonary effort is normal.     Breath sounds: Normal breath sounds. No wheezing or rales.  Musculoskeletal:     Cervical back: Neck supple.     Right lower leg: No edema.     Left lower leg: No edema.    CARDIAC DATABASE: EKG 08/21/2022: Sinus rhythm with first-degree AV block at 73 bpm.  Left axis deviation.  Left atrial enlargement.  Incomplete right bundle branch block.  Left anterior fascicular block.  No evidence of ischemia or underlying injury pattern.  Compared to previous EKG on 08/22/2021, no significant change.  Echocardiogram: Echocardiogram 08/18/2022:  Normal LV systolic function with visual EF 55-60%. Left ventricle cavity  is normal in size. Normal left ventricular wall thickness. Normal global  wall motion. Doppler evidence of grade I (impaired) diastolic dysfunction,  normal LAP. Calculated EF 67%.  Structurally normal tricuspid valve with no regurgitation. No evidence of  pulmonary hypertension.  The aortic root is normal. Mildly dilated ascending aorta.  No significant change compared to 06/2019.   Stress Testing:  Exercise tetrofosmin stress test  08/07/2019: Resting EKG normal sinus rhythm, IRBBB. Stress EKG negative for myocardial ischemia. Patient exercised on [place] protocol for 5:00 minutes, achieving approximately 5.4 METs. Exercise was terminated due to fatigue/weakness. Myocardial perfusion imaging is normal. Left ventricular ejection fraction is 53% with normal wall motion. Low risk study.  Heart Catheterization: None  LABORATORY DATA:    Latest Ref Rng & Units 06/26/2019    1:40 PM  CBC  WBC 4.0 - 10.5 K/uL 10.0   Hemoglobin 13.0 - 17.0 g/dL 17.9    Hematocrit 39.0 - 52.0 % 52.8   Platelets 150 - 400 K/uL 189        Latest Ref Rng & Units 03/15/2020   12:00 PM 06/26/2019    1:40 PM  CMP  Glucose 65 - 99 mg/dL 97  143   BUN 8 - 27 mg/dL 13  13   Creatinine 0.76 - 1.27 mg/dL 1.11  0.96   Sodium 134 - 144 mmol/L 139  139   Potassium 3.5 - 5.2 mmol/L 4.1  3.9   Chloride 96 - 106 mmol/L 100  102   CO2 20 - 29 mmol/L 25  27   Calcium 8.6 - 10.2 mg/dL 9.9  10.2   Total Protein 6.5 - 8.1 g/dL  7.5   Total Bilirubin 0.3 - 1.2 mg/dL  1.1   Alkaline Phos 38 - 126 U/L  61   AST 15 - 41 U/L  23  ALT 0 - 44 U/L  20     External Labs: Collected: August 12, 2021 records available in [place]. Sodium 141, potassium 4.4, chloride 101, bicarb 31, BUN 10, creatinine 0.95 AST 23, ALT 21, alkaline phosphatase 60. eGFR 74 mL/min per 1.73 m Total cholesterol 116, triglycerides 70, HDL 50, LDL 52, non-HDL 66 TSH 0.78 Hemoglobin 16.2 g/dL, hematocrit 47.5%  FINAL MEDICATION LIST END OF ENCOUNTER: Meds ordered this encounter  Medications   ezetimibe (ZETIA) 10 MG tablet    Sig: Take 1 tablet (10 mg total) by mouth daily.    Dispense:  90 tablet    Refill:  3    Order Specific Question:   Supervising Provider    Answer:   Adrian Prows [[id number]]   hydrochlorothiazide (MICROZIDE) 12.5 MG capsule    Sig: Take 1 capsule (12.5 mg total) by mouth in the morning.    Dispense:  90 capsule    Refill:  3    Order Specific Question:   Supervising Provider    Answer:   Adrian Prows [[id number]]   metoprolol succinate (TOPROL-XL) 50 MG 24 hr tablet    Sig: Take 1 tablet (50 mg total) by mouth daily. TAKE WITH OR IMMEDIATELY FOLLOWING A MEAL.    Dispense:  90 tablet    Refill:  3    Order Specific Question:   Supervising Provider    Answer:   Adrian Prows [[id number]]   rosuvastatin (CRESTOR) 20 MG tablet    Sig: Take 1 tablet (20 mg total) by mouth daily.    Dispense:  90 tablet    Refill:  3    Order Specific Question:   Supervising Provider    Answer:    Frederica Kuster     Current Outpatient Medications:    cholecalciferol (VITAMIN D3) 25 MCG (1000 UNIT) tablet, 1 tablet, Disp: , Rfl:    levothyroxine (SYNTHROID) 88 MCG tablet, Take 88 mcg by mouth daily before breakfast., Disp: , Rfl:    memantine (NAMENDA XR) 14 MG CP24 24 hr capsule, Take 1 capsule (14 mg total) by mouth daily., Disp: 30 capsule, Rfl: 5   nystatin ointment (MYCOSTATIN), Apply 1 Application topically 2 (two) times daily., Disp: , Rfl:    olmesartan (BENICAR) 40 MG tablet, Take 1 tablet (40 mg total) by mouth daily., Disp: 30 tablet, Rfl: 0   tamsulosin (FLOMAX) 0.4 MG CAPS capsule, Take 0.4 mg by mouth daily., Disp: , Rfl:    vitamin B-12 (CYANOCOBALAMIN) 1000 MCG tablet, 1 tablet, Disp: , Rfl:    ezetimibe (ZETIA) 10 MG tablet, Take 1 tablet (10 mg total) by mouth daily., Disp: 90 tablet, Rfl: 3   hydrochlorothiazide (MICROZIDE) 12.5 MG capsule, Take 1 capsule (12.5 mg total) by mouth in the morning., Disp: 90 capsule, Rfl: 3   metoprolol succinate (TOPROL-XL) 50 MG 24 hr tablet, Take 1 tablet (50 mg total) by mouth daily. TAKE WITH OR IMMEDIATELY FOLLOWING A MEAL., Disp: 90 tablet, Rfl: 3   rosuvastatin (CRESTOR) 20 MG tablet, Take 1 tablet (20 mg total) by mouth daily., Disp: 90 tablet, Rfl: 3  IMPRESSION:    ICD-10-CM   1. Primary hypertension  I10 EKG 12-Lead    2. Mixed hyperlipidemia  E78.2     3. OSA on CPAP  G47.33     4. Essential hypertension  I10 hydrochlorothiazide (MICROZIDE) 12.5 MG capsule       RECOMMENDATIONS: SATOSHI KALAS is a 81 y.o. male whose past medical history and  cardiovascular risk factors include: Hypertension, hyperlipidemia, hypothyroidism, OSA on CPAP, advanced age.  Primary hypertension Blood pressure is well controlled today.  He does not monitor his blood pressure at home but does report that he has blood pressure checked at PCP's since they are in acceptable range.   Continue current medications as ordered without any  changes today. I have refilled meds at his request. Received results of echocardiogram with patient and daughter.  Mixed hyperlipidemia Continues on rosuvastatin 20 mg daily and Zetia 74m daily without myalgias. He does report that he had labs checked at his primary care visit last month, will request these labs.  He also has a follow-up with his primary care in January and will have labs checked at that time. Managed by PCP.  OSA on CPAP He reports compliance with his CPAP.  Managed by PCP.   Orders Placed This Encounter  Procedures   EKG 12-Lead   --Continue cardiac medications as reconciled in final medication list. --Return in about 6 months (around 02/19/2023) for HTN. Or sooner if needed. --Continue follow-up with your primary care physician regarding the management of your other chronic comorbid conditions.  Patient's questions and concerns were addressed to his satisfaction. He voices understanding of the instructions provided during this encounter.   This note was created using a voice recognition software as a result there may be grammatical errors inadvertently enclosed that do not reflect the nature of this encounter. Every attempt is made to correct such errors.   BErnst Spell AVirginiaPager: [phone]: [phone]

## 2022-12-09 NOTE — Progress Notes (Unsigned)
[facility] [address] [place]. [place] [address] [phone]       OFFICE FOLLOW UP NOTE  [name] Date of Birth:  1941-06-29 Medical Record Number:  [id number]   Primary neurologist: Dr. Rexene Conway Reason for visit: Dementia follow-up    SUBJECTIVE:   CHIEF COMPLAINT:  No chief complaint on file.   HPI:   Update 12/10/2022 JM: Patient returns for 39-monthfollow-up.  At prior visit, switched memantine IR to XR formulation due to difficulty remembering twice daily dosing.  Currently taking 14 mg daily.  Tolerating without side effects.  Reports cognition ***. MMSE today ***/30 (prior 21/30).       History provided for reference purposes only Update 06/09/2022 JM: Patient returns for 4 months cognitive follow-up.  Accompanied by his daughter who provides majority of history.  Increased Namenda dosage with gradual titration but has been only taking '10mg'$  daily due to difficulty remembering twice daily dosing.  Denies any behavioral concerns.  Continues to live alone but daughter does check on him frequently.  Reports cognition has been stable since prior visit.  MMSE today 21/30 (prior 18/30).  Daughter also notices increased urinary incontinence, does follow with urology and currently on Flomax.  Completed MBS for swallowing concerns which did not show any significant concerns of swallowing or evidence of aspiration. He denies having any issues with swallowing at this time.  No further concerns at this time  Update 02/03/2022 JM: Returns for 34-monthollow-up visit regarding memory loss accompanied by his daughter who provides majority of history.  Since prior visit, cognition has been stable. Has continued on Namenda '5mg'$  nightly - denies side effects. Was previously intolerant to Aricept. He continues to live alone but does have daughter checking on him frequently and assists with IADLs.  Able to maintain ADLs independently. At times, will have  difficulty swallowing food and at times will cough - was seen by GI Sept/Nov 2022 and had esophagus stretching but symptoms have persisted. Daughter also has noticed he may feel overwhelmed and will "shut down" if sees too much food on table/plate.  Daughter sent MyChart message prior to today's visit regarding behaviors consisting of repetitive hand movements, tapping, vocalizations (beeping noises) and repetitive words.  These were also discussed at prior visit with Dr. AtRexene AlbertsShe believes these have been worsening. Does endorse underlying anxiety which has been chronic. Will feel overwhelmed if in small crowded area but does enjoy going to the grocery store and getting out of the house accompanied by his daughter.  No new concerns at this time.  Consult visit 11/04/2021 Dr. AtRexene Albertsprovided for reference purposes only) [name] an [age]ear-old right-handed gentleman with an underlying medical history of hypertension, hyperlipidemia, complex sleep apnea, hypothyroidism, history of prostate cancer, ED, and borderline overweight state, who reports a little about his ongoing symptoms and history, history is primarily provided by his daughter.  She reports a several year history of forgetfulness, repeating himself, and difficulty navigating while driving.  He has not driven a car since summer 2022 because he was getting lost.  He stopped using the donepezil some 6 months ago because of side effects including dizziness, nausea and appetite loss.  His appetite improved after he stopped the donepezil. He does report a family history of.  He lived to be around [age] Mom lived to be around 789she did not have any memory loss.  He had 3 brothers and 1 sister, sister had some memory loss,  his siblings have passed away.  His wife also passed away a few years ago.  He lives alone, daughter lives close by, son lives in [place].  Patient does not cook, he is able to heat up food.  He would not want to restart using his  BiPAP, he had difficulty tolerating it and would forget to use it.  He has not used it in several months or over 1 year.  He has not fallen recently.  He did fall last year and was checked out in the ER per daughter.  He does not have much in the way of physical activity.  He has a dog in the house, but the dog has mobility issues and the does not typically walk the dog.  He does not always drink enough water, he likes to drink soda, about 2 cans/day, no coffee, rare alcohol, non-smoker.  His daughter has noticed in the past few months repetitive vocalizations and movements almost akin to vocal tics and repetitive motor movements.  He has not had any obvious behavioral or mood related disturbances.   I reviewed your office note from 08/15/2021.  He had blood work through your office on 08/12/2021 and I was able to review the results: CMP showed sodium of 141, potassium 4.4, BUN 10, creatinine 0.95, liver enzymes benign, TSH was 0.78, CBC with platelets and differential showed a borderline elevated hematocrit at 47.5, otherwise normal findings, urinalysis was negative.  Patient was on donepezil 10 mg daily at the time.   He saw Dr. Gasper Conway with [facility] neurology on 04/22/2021 for initial consultation for his memory loss and I was able to review with that note.  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  He was encouraged to continue with his PAP therapy.   He had a brain MRI without contrast on 06/26/2019 and I reviewed the results: IMPRESSION: 1.  No acute intracranial abnormality. 2. Signal changes in the cerebral white matter and left caudate compatible with chronic small vessel disease, relatively mild for age.   He had a head CT without contrast on 06/26/2019 and I reviewed the results: IMPRESSION: No acute intracranial process.       I have seen him on 2 occasions for his sleep apnea, with a  primary central component. He was not compliant with his BiPAP ST.    The patient's allergies, current medications, family history, past medical history, past social history, past surgical history and problem list were reviewed and updated as appropriate.      ROS:   14 system review of systems performed and negative with exception of those listed in HPI  PMH:  Past Medical History:  Diagnosis Date   Dementia ([place])    Hyperlipidemia    Hypertension    OSA on CPAP     PSH:  Past Surgical History:  Procedure Laterality Date   THYROID SURGERY      Social History:  Social History   Socioeconomic History   Marital status: Widowed    Spouse name: Not on file   Number of children: 2   Years of education: Not on file   Highest education level: Professional school degree (e.g., MD, DDS, DVM, JD)  Occupational History    Comment: Conservator, museum/gallery  Tobacco Use   Smoking status: Never   Smokeless tobacco: Never  Vaping Use   Vaping Use: Never used  Substance and Sexual Activity  Alcohol use: Yes    Alcohol/week: 2.0 standard drinks of alcohol    Types: 2 Cans of beer per week    Comment: occ   Drug use: No   Sexual activity: Not on file  Other Topics Concern   Not on file  Social History Narrative   06/09/22 lives alone   Social Determinants of Health   Financial Resource Strain: Not on file  Food Insecurity: Not on file  Transportation Needs: Not on file  Physical Activity: Not on file  Stress: Not on file  Social Connections: Not on file  Intimate Partner Violence: Not on file    Family History:  Family History  Problem Relation Age of Onset   Colon cancer Mother    Healthy Father    Alzheimer's disease Father    Hypertension Brother     Medications:   Current Outpatient Medications on File Prior to Visit  Medication Sig Dispense Refill   cholecalciferol (VITAMIN D3) 25 MCG (1000 UNIT) tablet 1 tablet     ezetimibe (ZETIA) 10 MG tablet Take 1 tablet  (10 mg total) by mouth daily. 90 tablet 3   hydrochlorothiazide (MICROZIDE) 12.5 MG capsule Take 1 capsule (12.5 mg total) by mouth in the morning. 90 capsule 3   levothyroxine (SYNTHROID) 88 MCG tablet Take 88 mcg by mouth daily before breakfast.     memantine (NAMENDA XR) 14 MG CP24 24 hr capsule Take 1 capsule (14 mg total) by mouth daily. 30 capsule 5   metoprolol succinate (TOPROL-XL) 50 MG 24 hr tablet Take 1 tablet (50 mg total) by mouth daily. TAKE WITH OR IMMEDIATELY FOLLOWING A MEAL. 90 tablet 3   nystatin ointment (MYCOSTATIN) Apply 1 Application topically 2 (two) times daily.     olmesartan (BENICAR) 40 MG tablet Take 1 tablet (40 mg total) by mouth daily. 30 tablet 0   rosuvastatin (CRESTOR) 20 MG tablet Take 1 tablet (20 mg total) by mouth daily. 90 tablet 3   tamsulosin (FLOMAX) 0.4 MG CAPS capsule Take 0.4 mg by mouth daily.     vitamin B-12 (CYANOCOBALAMIN) 1000 MCG tablet 1 tablet     No current facility-administered medications on file prior to visit.    Allergies:  No Known Allergies    OBJECTIVE:  Physical Exam  There were no vitals filed for this visit.  There is no height or weight on file to calculate BMI. No results found.  General: well developed, well nourished, very pleasant elderly Caucasian male, seated, in no evident distress Head: head normocephalic and atraumatic.   Neck: supple with no carotid or supraclavicular bruits Cardiovascular: regular rate and rhythm, no murmurs Musculoskeletal: no deformity Skin:  no rash/petichiae Vascular:  Normal pulses all extremities   Neurologic Exam Mental Status: Awake and fully alert.  Fluent speech and language.  Disoriented to place and time. Recent memory impaired and remote memory intact. Attention span, concentration and fund of knowledge impaired. Mood and affect relatively flat with limited participation in conversation unless spoken to.     06/09/2022    2:34 PM 11/04/2021    3:31 PM  MMSE - Mini  Mental State Exam  Orientation to time 0 0  Orientation to Place 4 4  Registration 3 3  Attention/ Calculation 5 2  Recall 0 0  Language- name 2 objects 2 2  Language- repeat 1 1  Language- follow 3 step command 3 3  Language- read & follow direction 1 1  Write a sentence 1 1  Copy design 1 1  Total score 21 18   Cranial Nerves: Pupils equal, briskly reactive to light. Extraocular movements full without nystagmus. Visual fields full to confrontation. Hearing intact. Facial sensation intact. Face, tongue, palate moves normally and symmetrically.  Motor: Normal bulk and tone. Normal strength in all tested extremity muscles Sensory.: intact to touch , pinprick , position and vibratory sensation.  Coordination: Rapid alternating movements normal in all extremities. Finger-to-nose and heel-to-shin performed accurately bilaterally. Gait and Station: Arises from chair without difficulty. Stance is normal. Gait demonstrates normal stride length and balance without use of AD.  Reflexes: 1+ and symmetric. Toes downgoing.         ASSESSMENT: Edward Conway is a 82 y.o. 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  Advised daughter to call office after couple weeks if further dosage increase is needed.   Incontinence: Continue to follow with urology for ongoing monitoring and management     Follow up in 6 months or call earlier if needed   CC:  PCP: Deland Pretty, MD    I spent 31 minutes of face-to-face and non-face-to-face time with patient and daughter.  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 [place], [place] [address]  Phone [phone] Fax [phone] Note: This document was prepared with digital dictation and possible smart phrase technology. Any transcriptional errors that result from this process are unintentional.

## 2022-12-10 ENCOUNTER — Encounter: Payer: Self-pay | Admitting: Adult Health

## 2022-12-10 ENCOUNTER — Ambulatory Visit (INDEPENDENT_AMBULATORY_CARE_PROVIDER_SITE_OTHER): Payer: Medicare Other | Admitting: Adult Health

## 2022-12-10 VITALS — BP 105/72 | HR 70 | Ht 67.0 in | Wt 178.0 lb

## 2022-12-10 DIAGNOSIS — F0280 Dementia in other diseases classified elsewhere without behavioral disturbance: Secondary | ICD-10-CM | POA: Diagnosis not present

## 2022-12-10 DIAGNOSIS — F0150 Vascular dementia without behavioral disturbance: Secondary | ICD-10-CM

## 2022-12-10 DIAGNOSIS — G309 Alzheimer's disease, unspecified: Secondary | ICD-10-CM | POA: Diagnosis not present

## 2022-12-10 MED ORDER — MEMANTINE HCL ER 14 MG PO CP24: 14.0000 mg | ORAL_CAPSULE | Freq: Every day | ORAL | 3 refills | Status: DC

## 2022-12-10 NOTE — Patient Instructions (Signed)
Continue Namenda XR '14mg'$  daily - if symptoms become worse or bothersome, please let me know    Follow up in 6 months or call earlier if needed

## 2023-02-19 ENCOUNTER — Encounter: Payer: Self-pay | Admitting: Cardiology

## 2023-02-19 ENCOUNTER — Ambulatory Visit: Payer: Medicare Other | Admitting: Cardiology

## 2023-02-19 ENCOUNTER — Ambulatory Visit: Payer: BLUE CROSS/BLUE SHIELD

## 2023-02-19 VITALS — BP 147/90 | HR 71 | Ht 67.0 in | Wt 166.8 lb

## 2023-02-19 DIAGNOSIS — E782 Mixed hyperlipidemia: Secondary | ICD-10-CM

## 2023-02-19 DIAGNOSIS — I10 Essential (primary) hypertension: Secondary | ICD-10-CM

## 2023-02-19 DIAGNOSIS — G4733 Obstructive sleep apnea (adult) (pediatric): Secondary | ICD-10-CM | POA: Diagnosis not present

## 2023-02-19 NOTE — Progress Notes (Signed)
Edward Conway Date of Birth: [birth year] MRN: [id number] Primary Care Provider:Pharr, Zollie Beckers, MD Former Cardiology Providers: Toniann Fail, MSN, APRN, FNP-C Primary Cardiologist: Tessa Lerner, DO, [facility]  Date: 02/19/23 Last Office Visit: 08/21/2022   Chief Complaint  Patient presents with   Follow-up    hypertension   HPI  Edward Conway is a 82 y.o.  male whose past medical history and cardiovascular risk factors include: Alzheimer's disease, Hypertension, hyperlipidemia, OSA not on CPAP, hypothyroidism, advanced age.  Patient presents today for 3-month follow-up visit.  He is accompanied by his daughter at today's office visit.  He provides verbal consent with having her present during today's encounter.  He was last seen by my nurse practitioner back in November 2023 and since then he is doing well from a cardiovascular standpoint.  Denies anginal chest pain or heart failure symptoms.  His overall physical activity levels have been reduced due to back pain and progression of Alzheimer's disease.  Patient and his daughter goal for walk 10 to 15 minutes 3-4 times a week.  He no longer is driving for the same reasons.  No longer is using his CPAP secondary to compliance.  ALLERGIES: No Known Allergies   MEDICATION LIST PRIOR TO VISIT: Current Outpatient Medications on File Prior to Visit  Medication Sig Dispense Refill   cholecalciferol (VITAMIN D3) 25 MCG (1000 UNIT) tablet 1 tablet     ezetimibe (ZETIA) 10 MG tablet Take 1 tablet (10 mg total) by mouth daily. 90 tablet 3   hydrochlorothiazide (MICROZIDE) 12.5 MG capsule Take 1 capsule (12.5 mg total) by mouth in the morning. 90 capsule 3   levothyroxine (SYNTHROID) 75 MCG tablet Take 75 mcg by mouth daily.     memantine (NAMENDA XR) 14 MG CP24 24 hr capsule Take 1 capsule (14 mg total) by mouth daily. 90 capsule 3   metoprolol succinate (TOPROL-XL) 50 MG 24 hr tablet Take 1 tablet (50 mg total) by mouth daily.  TAKE WITH OR IMMEDIATELY FOLLOWING A MEAL. 90 tablet 3   nystatin ointment (MYCOSTATIN) Apply 1 Application topically as needed.     olmesartan (BENICAR) 40 MG tablet Take 1 tablet (40 mg total) by mouth daily. 30 tablet 0   rosuvastatin (CRESTOR) 20 MG tablet Take 1 tablet (20 mg total) by mouth daily. 90 tablet 3   tamsulosin (FLOMAX) 0.4 MG CAPS capsule Take 0.4 mg by mouth daily.     vitamin B-12 (CYANOCOBALAMIN) 1000 MCG tablet 1 tablet     No current facility-administered medications on file prior to visit.    PAST MEDICAL HISTORY: Past Medical History:  Diagnosis Date   Dementia (HCC)    Hyperlipidemia    Hypertension    OSA on CPAP     PAST SURGICAL HISTORY: Past Surgical History:  Procedure Laterality Date   THYROID SURGERY      FAMILY HISTORY: The patient's family history includes Alzheimer's disease in his father; Colon cancer in his mother; Healthy in his father; Hypertension in his brother.   SOCIAL HISTORY:  The patient  reports that he has never smoked. He has never used smokeless tobacco. He reports that he does not currently use alcohol. He reports that he does not use drugs.  Review of Systems  Cardiovascular:  Negative for chest pain, claudication, dyspnea on exertion, irregular heartbeat, leg swelling, near-syncope, orthopnea, palpitations, paroxysmal nocturnal dyspnea and syncope.  Respiratory:  Negative for shortness of breath.   Hematologic/Lymphatic: Negative for bleeding problem.  Musculoskeletal:  Negative for muscle cramps and myalgias.  Neurological:  Negative for dizziness and light-headedness.    PHYSICAL EXAM:    02/19/2023   10:58 AM 12/10/2022    1:32 PM 08/21/2022    2:14 PM  Vitals with BMI  Height 5\' 7"  5\' 7"  5\' 7"   Weight 166 lbs 13 oz 178 lbs 176 lbs 6 oz  BMI 26.12 27.87 27.62  Systolic 147 105 161  Diastolic 90 72 84  Pulse 71 70 85   Physical Exam  Constitutional: No distress.  Age appropriate, hemodynamically stable.    Neck: No JVD present.  Cardiovascular: Normal rate, regular rhythm, S1 normal, S2 normal, intact distal pulses and normal pulses. Exam reveals no gallop, no S3 and no S4.  No murmur heard. Pulses:      Dorsalis pedis pulses are 2+ on the right side and 2+ on the left side.       Posterior tibial pulses are 2+ on the right side and 2+ on the left side.  Pulmonary/Chest: Effort normal and breath sounds normal. No stridor. He has no wheezes. He has no rales.  Abdominal: Soft. Bowel sounds are normal. He exhibits no distension. There is no abdominal tenderness.  Musculoskeletal:        General: No edema.     Cervical back: Neck supple.  Neurological: He is alert and oriented to person, place, and time. He has intact cranial nerves (2-12).  Skin: Skin is warm and moist.   CARDIAC DATABASE: EKG  Feb 19, 2023: Sinus rhythm, 63 bpm, first-degree AV block, low voltage, left axis, left anterior fascicular block, old inferior infarct  Echocardiogram: Echocardiogram 08/18/2022:  Normal LV systolic function with visual EF 55-60%. Left ventricle cavity is normal in size. Normal left ventricular wall thickness. Normal global wall motion. Doppler evidence of grade I (impaired) diastolic dysfunction, normal LAP. Calculated EF 67%.  Structurally normal tricuspid valve with no regurgitation. No evidence of pulmonary hypertension.  The aortic root is normal. Mildly dilated ascending aorta, 38mm.  No significant change compared to 06/2019.   Stress Testing:  Exercise tetrofosmin stress test  08/07/2019: Resting EKG normal sinus rhythm, IRBBB. Stress EKG negative for myocardial ischemia. Patient exercised on Kennan protocol for 5:00 minutes, achieving approximately 5.4 METs. Exercise was terminated due to fatigue/weakness. Myocardial perfusion imaging is normal. Left ventricular ejection fraction is 53% with normal wall motion. Low risk study.  Heart Catheterization: None  LABORATORY DATA:    Latest Ref  Rng & Units 06/26/2019    1:40 PM  CBC  WBC 4.0 - 10.5 K/uL 10.0   Hemoglobin 13.0 - 17.0 g/dL 09.6   Hematocrit 04.5 - 52.0 % 52.8   Platelets 150 - 400 K/uL 189        Latest Ref Rng & Units 03/15/2020   12:00 PM 06/26/2019    1:40 PM  CMP  Glucose 65 - 99 mg/dL 97  409   BUN 8 - 27 mg/dL 13  13   Creatinine 8.11 - 1.27 mg/dL 9.14  7.82   Sodium 956 - 144 mmol/L 139  139   Potassium 3.5 - 5.2 mmol/L 4.1  3.9   Chloride 96 - 106 mmol/L 100  102   CO2 20 - 29 mmol/L 25  27   Calcium 8.6 - 10.2 mg/dL 9.9  21.3   Total Protein 6.5 - 8.1 g/dL  7.5   Total Bilirubin 0.3 - 1.2 mg/dL  1.1   Alkaline Phos 38 - 126 U/L  61   AST 15 - 41 U/L  23   ALT 0 - 44 U/L  20     External Labs: Collected: August 12, 2021 records available in Care Everywhere. Sodium 141, potassium 4.4, chloride 101, bicarb 31, BUN 10, creatinine 0.95 AST 23, ALT 21, alkaline phosphatase 60. eGFR 74 mL/min per 1.73 m Total cholesterol 116, triglycerides 70, HDL 50, LDL 52, non-HDL 66 TSH 0.78 Hemoglobin 16.2 g/dL, hematocrit 16.1%  FINAL MEDICATION LIST END OF ENCOUNTER: No orders of the defined types were placed in this encounter.    Current Outpatient Medications:    cholecalciferol (VITAMIN D3) 25 MCG (1000 UNIT) tablet, 1 tablet, Disp: , Rfl:    ezetimibe (ZETIA) 10 MG tablet, Take 1 tablet (10 mg total) by mouth daily., Disp: 90 tablet, Rfl: 3   hydrochlorothiazide (MICROZIDE) 12.5 MG capsule, Take 1 capsule (12.5 mg total) by mouth in the morning., Disp: 90 capsule, Rfl: 3   levothyroxine (SYNTHROID) 75 MCG tablet, Take 75 mcg by mouth daily., Disp: , Rfl:    memantine (NAMENDA XR) 14 MG CP24 24 hr capsule, Take 1 capsule (14 mg total) by mouth daily., Disp: 90 capsule, Rfl: 3   metoprolol succinate (TOPROL-XL) 50 MG 24 hr tablet, Take 1 tablet (50 mg total) by mouth daily. TAKE WITH OR IMMEDIATELY FOLLOWING A MEAL., Disp: 90 tablet, Rfl: 3   nystatin ointment (MYCOSTATIN), Apply 1 Application  topically as needed., Disp: , Rfl:    olmesartan (BENICAR) 40 MG tablet, Take 1 tablet (40 mg total) by mouth daily., Disp: 30 tablet, Rfl: 0   rosuvastatin (CRESTOR) 20 MG tablet, Take 1 tablet (20 mg total) by mouth daily., Disp: 90 tablet, Rfl: 3   tamsulosin (FLOMAX) 0.4 MG CAPS capsule, Take 0.4 mg by mouth daily., Disp: , Rfl:    vitamin B-12 (CYANOCOBALAMIN) 1000 MCG tablet, 1 tablet, Disp: , Rfl:   IMPRESSION:    ICD-10-CM   1. Essential hypertension  I10 EKG 12-Lead    2. Mixed hyperlipidemia  E78.2     3. OSA on CPAP  G47.33        RECOMMENDATIONS: AASHIR PORTWOOD is a 82 y.o. male whose past medical history and cardiovascular risk factors include: Hypertension, hyperlipidemia, hypothyroidism, OSA not on CPAP, advanced age.  Patient presents today for 69-month follow-up visit accompanied by his daughter at today's encounter.  He denies anginal chest pain or heart failure symptoms.  He remained stable over the last 6 months.  His overall functional capacity remains stable but limited due to back pain and underlying Alzheimer's disease.  Patient's daughter states that they try not to have him exercise alone due to safety reasons and he no longer is driving as well.  Office blood pressures are not at goal but he also forgot to take his medications this morning.  Otherwise, daughter reassures that home blood pressures are better controlled.  He is no longer using his CPAP given his sleep apnea.  Daughter states that he is not able to tolerate therapy and compliance is an issue given his underlying Alzheimer's disease.  Going forward I will be more than happy to see him either on an annual basis or as needed.  Actively not managing any of his comorbidities but he is more than welcome to come back sooner if change in cardiovascular health or clinical status.  Both patient and daughter were thankful for the care provided thus far.  I would like to defer him back to his PCP  for  now.  Requested most recent labs from PCP for review/reference.  Orders Placed This Encounter  Procedures   EKG 12-Lead   --Continue cardiac medications as reconciled in final medication list. --Return in about 1 year (around 02/19/2024) for Annual follow up visit. Or sooner if needed. --Continue follow-up with your primary care physician regarding the management of your other chronic comorbid conditions.  Patient's questions and concerns were addressed to his satisfaction. He voices understanding of the instructions provided during this encounter.   This note was created using a voice recognition software as a result there may be grammatical errors inadvertently enclosed that do not reflect the nature of this encounter. Every attempt is made to correct such errors.  Tessa Lerner, [place], [facility]  Pager:  [phone] Office: [phone]

## 2023-06-09 NOTE — Progress Notes (Unsigned)
[facility] [address] [place]. [place] [address] [phone]       OFFICE FOLLOW UP NOTE  Mr. KENLIN PIETRZYK Date of Birth:  June 18, 1941 Medical Record Number:  [id number]   Primary neurologist: Dr. Frances Furbish Reason for visit: Dementia follow-up    SUBJECTIVE:   CHIEF COMPLAINT:  No chief complaint on file.   HPI:   Update 06/09/2023 JM: Patient returns for follow-up visit.  Reports cognition ***.  Continues on Namenda XR 14mg  daily.         History provided for reference purposes only Update 12/10/2022 JM: Patient returns for 56-month follow-up accompanied by his daughter.  At prior visit, switched memantine IR to XR formulation due to difficulty remembering twice daily dosing.  Currently taking 14 mg daily.  Tolerating well, at times will have GI issues after taking but not overly bothersome.  Reports cognition relatively stable, more issues with short-term memory. MMSE today 18/30 (21/30 05/2022, 18/30 10/2021) with deficits in orientation, attention/calculation, and recall.    He continues to live alone but now has a caregiver Mon-Fri in the morning, daughter continues to assist him in the evenings.  No behavioral concerns.  Sleeping well.  Appetite improved.  Continues to have incontinence but unchanged since prior visit, was released by urology, now monitored by PCP.   Update 06/09/2022 JM: Patient returns for 4 months cognitive follow-up.  Accompanied by his daughter who provides majority of history.  Increased Namenda dosage with gradual titration but has been only taking 10mg  daily due to difficulty remembering twice daily dosing.  Denies any behavioral concerns.  Continues to live alone but daughter does check on him frequently.  Reports cognition has been stable since prior visit.  MMSE today 21/30 (prior 18/30).  Daughter also notices increased urinary incontinence, does follow with urology and currently on Flomax.  Completed MBS for swallowing  concerns which did not show any significant concerns of swallowing or evidence of aspiration. He denies having any issues with swallowing at this time.  No further concerns at this time  Update 02/03/2022 JM: Returns for 59-month follow-up visit regarding memory loss accompanied by his daughter who provides majority of history.  Since prior visit, cognition has been stable. Has continued on Namenda 5mg  nightly - denies side effects. Was previously intolerant to Aricept. He continues to live alone but does have daughter checking on him frequently and assists with IADLs.  Able to maintain ADLs independently. At times, will have difficulty swallowing food and at times will cough - was seen by GI Sept/Nov 2022 and had esophagus stretching but symptoms have persisted. Daughter also has noticed he may feel overwhelmed and will "shut down" if sees too much food on table/plate.  Daughter sent MyChart message prior to today's visit regarding behaviors consisting of repetitive hand movements, tapping, vocalizations (beeping noises) and repetitive words.  These were also discussed at prior visit with Dr. Frances Furbish. She believes these have been worsening. Does endorse underlying anxiety which has been chronic. Will feel overwhelmed if in small crowded area but does enjoy going to the grocery store and getting out of the house accompanied by his daughter.  No new concerns at this time.  Consult visit 11/04/2021 Dr. Frances Furbish (provided for reference purposes only) Mr. Kalmbach is an 82 year-old right-handed gentleman with an underlying medical history of hypertension, hyperlipidemia, complex sleep apnea, hypothyroidism, history of prostate cancer, ED, and borderline overweight state, who reports a little about his ongoing symptoms and history, history is primarily  provided by his daughter.  She reports a several year history of forgetfulness, repeating himself, and difficulty navigating while driving.  He has not driven a car since  summer 2022 because he was getting lost.  He stopped using the donepezil some 6 months ago because of side effects including dizziness, nausea and appetite loss.  His appetite improved after he stopped the donepezil. He does report a family history of.  He lived to be around 78.  Mom lived to be around 59, she did not have any memory loss.  He had 3 brothers and 1 sister, sister had some memory loss, his siblings have passed away.  His wife also passed away a few years ago.  He lives alone, daughter lives close by, son lives in [place].  Patient does not cook, he is able to heat up food.  He would not want to restart using his BiPAP, he had difficulty tolerating it and would forget to use it.  He has not used it in several months or over 1 year.  He has not fallen recently.  He did fall last year and was checked out in the ER per daughter.  He does not have much in the way of physical activity.  He has a dog in the house, but the dog has mobility issues and the does not typically walk the dog.  He does not always drink enough water, he likes to drink soda, about 2 cans/day, no coffee, rare alcohol, non-smoker.  His daughter has noticed in the past few months repetitive vocalizations and movements almost akin to vocal tics and repetitive motor movements.  He has not had any obvious behavioral or mood related disturbances.   I reviewed your office note from 08/15/2021.  He had blood work through your office on 08/12/2021 and I was able to review the results: CMP showed sodium of 141, potassium 4.4, BUN 10, creatinine 0.95, liver enzymes benign, TSH was 0.78, CBC with platelets and differential showed a borderline elevated hematocrit at 47.5, otherwise normal findings, urinalysis was negative.  Patient was on donepezil 10 mg daily at the time.   He saw Dr. Jyl Heinz with [facility] neurology on 04/22/2021 for initial consultation for his memory loss and I was able to review with that note.  He was  advised to start donepezil and was suspected to have mild dementia, likely Alzheimer's type with mixed component possible.  He was encouraged to continue with his PAP therapy.   He had a brain MRI without contrast on 06/26/2019 and I reviewed the results: IMPRESSION: 1.  No acute intracranial abnormality. 2. Signal changes in the cerebral white matter and left caudate compatible with chronic small vessel disease, relatively mild for age.   He had a head CT without contrast on 06/26/2019 and I reviewed the results: IMPRESSION: No acute intracranial process.       I have seen him on 2 occasions for his sleep apnea, with a primary central component. He was not compliant with his BiPAP ST.    The patient's allergies, current medications, family history, past medical history, past social history, past surgical history and problem list were reviewed and updated as appropriate.      ROS:   14 system review of systems performed and negative with exception of those listed in HPI  PMH:  Past Medical History:  Diagnosis Date   Dementia (HCC)    Hyperlipidemia    Hypertension    OSA on CPAP  PSH:  Past Surgical History:  Procedure Laterality Date   THYROID SURGERY      Social History:  Social History   Socioeconomic History   Marital status: Widowed    Spouse name: Not on file   Number of children: 2   Years of education: Not on file   Highest education level: Professional school degree (e.g., MD, DDS, DVM, JD)  Occupational History    Comment: Tree surgeon  Tobacco Use   Smoking status: Never   Smokeless tobacco: Never  Vaping Use   Vaping status: Never Used  Substance and Sexual Activity   Alcohol use: Not Currently    Comment: occ   Drug use: No   Sexual activity: Not on file  Other Topics Concern   Not on file  Social History Narrative   06/09/22 lives alone   Social Determinants of Health   Financial Resource Strain: Not on file  Food Insecurity: Not on  file  Transportation Needs: Not on file  Physical Activity: Not on file  Stress: Not on file  Social Connections: Not on file  Intimate Partner Violence: Not on file    Family History:  Family History  Problem Relation Age of Onset   Colon cancer Mother    Healthy Father    Alzheimer's disease Father    Hypertension Brother     Medications:   Current Outpatient Medications on File Prior to Visit  Medication Sig Dispense Refill   cholecalciferol (VITAMIN D3) 25 MCG (1000 UNIT) tablet 1 tablet     ezetimibe (ZETIA) 10 MG tablet Take 1 tablet (10 mg total) by mouth daily. 90 tablet 3   hydrochlorothiazide (MICROZIDE) 12.5 MG capsule Take 1 capsule (12.5 mg total) by mouth in the morning. 90 capsule 3   levothyroxine (SYNTHROID) 75 MCG tablet Take 75 mcg by mouth daily.     memantine (NAMENDA XR) 14 MG CP24 24 hr capsule Take 1 capsule (14 mg total) by mouth daily. 90 capsule 3   metoprolol succinate (TOPROL-XL) 50 MG 24 hr tablet Take 1 tablet (50 mg total) by mouth daily. TAKE WITH OR IMMEDIATELY FOLLOWING A MEAL. 90 tablet 3   nystatin ointment (MYCOSTATIN) Apply 1 Application topically as needed.     olmesartan (BENICAR) 40 MG tablet Take 1 tablet (40 mg total) by mouth daily. 30 tablet 0   rosuvastatin (CRESTOR) 20 MG tablet Take 1 tablet (20 mg total) by mouth daily. 90 tablet 3   tamsulosin (FLOMAX) 0.4 MG CAPS capsule Take 0.4 mg by mouth daily.     vitamin B-12 (CYANOCOBALAMIN) 1000 MCG tablet 1 tablet     No current facility-administered medications on file prior to visit.    Allergies:  No Known Allergies    OBJECTIVE:  Physical Exam  There were no vitals filed for this visit.   There is no height or weight on file to calculate BMI. No results found.  General: well developed, well nourished, very pleasant elderly Caucasian male, seated, in no evident distress Head: head normocephalic and atraumatic.   Neck: supple with no carotid or supraclavicular  bruits Cardiovascular: regular rate and rhythm, no murmurs Musculoskeletal: no deformity Skin:  no rash/petichiae Vascular:  Normal pulses all extremities   Neurologic Exam Mental Status: Awake and fully alert.  Fluent speech and language.  Disoriented to place and time. Recent memory impaired and remote memory intact. Attention span, concentration and fund of knowledge impaired. Mood and affect relatively flat with limited participation in conversation unless  spoken to.     12/10/2022    1:37 PM 06/09/2022    2:34 PM 11/04/2021    3:31 PM  MMSE - Mini Mental State Exam  Orientation to time 0 0 0  Orientation to Place 3 4 4   Registration 3 3 3   Attention/ Calculation 3 5 2   Recall 0 0 0  Language- name 2 objects 2 2 2   Language- repeat 1 1 1   Language- follow 3 step command 3 3 3   Language- read & follow direction 1 1 1   Write a sentence 1 1 1   Copy design 1 1 1   Total score 18 21 18    Cranial Nerves: Pupils equal, briskly reactive to light. Extraocular movements full without nystagmus. Visual fields full to confrontation. Hearing intact. Facial sensation intact. Face, tongue, palate moves normally and symmetrically.  Motor: Normal bulk and tone. Normal strength in all tested extremity muscles Sensory.: intact to touch , pinprick , position and vibratory sensation.  Coordination: Rapid alternating movements normal in all extremities. Finger-to-nose and heel-to-shin performed accurately bilaterally. Gait and Station: Arises from chair without difficulty. Stance is normal. Gait demonstrates normal stride length and balance without use of AD.  Reflexes: 1+ and symmetric. Toes downgoing.         ASSESSMENT: JAICION CAHN is a 82 y.o. 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 which has been relatively stable with ongoing use of Namenda   PLAN:  Mixed dementia:  MMSE today 18/30 (prior 21/30) Continue Namenda XR 14mg  daily, occasional GI issues, advised to call if these should  worsen or become bothersome.  No behavioral concerns Prior intolerance to Aricept     Follow up in 6 months or call earlier if needed    CC:  PCP: Merri Brunette, MD    I spent 24 minutes of face-to-face and non-face-to-face time with patient and daughter.  This included previsit chart review, lab review, study review, order entry, electronic health record documentation, patient and daughter education and discussion regarding above diagnoses and treatment plan and answered all other questions to patient and daughter satisfaction   Ihor Austin, [facility]  [facility] [address] [place], [place] [address]  Phone [phone] Fax [phone] Note: This document was prepared with digital dictation and possible smart phrase technology. Any transcriptional errors that result from this process are unintentional.

## 2023-06-10 ENCOUNTER — Ambulatory Visit (INDEPENDENT_AMBULATORY_CARE_PROVIDER_SITE_OTHER): Payer: Medicare Other | Admitting: Adult Health

## 2023-06-10 VITALS — BP 123/73 | HR 78 | Ht 67.0 in | Wt 177.2 lb

## 2023-06-10 DIAGNOSIS — G309 Alzheimer's disease, unspecified: Secondary | ICD-10-CM | POA: Diagnosis not present

## 2023-06-10 DIAGNOSIS — F0280 Dementia in other diseases classified elsewhere without behavioral disturbance: Secondary | ICD-10-CM | POA: Diagnosis not present

## 2023-06-10 DIAGNOSIS — F0150 Vascular dementia without behavioral disturbance: Secondary | ICD-10-CM

## 2023-06-10 MED ORDER — MEMANTINE HCL ER 21 MG PO CP24: 21.0000 mg | ORAL_CAPSULE | Freq: Every day | ORAL | 5 refills | Status: DC

## 2023-06-10 NOTE — Patient Instructions (Signed)
Your Plan:  Increase Namenda XR to 21 mg daily - if any side effects, please go back to 14mg  dosing and let me know    Follow up in 6 months or call earlier if needed     Thank you for coming to see [place] at [facility]. I hope we have been able to provide you high quality care today.  You may receive a patient satisfaction survey over the next few weeks. We would appreciate your feedback and comments so that we may continue to improve ourselves and the health of our patients.

## 2023-06-17 ENCOUNTER — Telehealth: Payer: Self-pay | Admitting: Neurology

## 2023-06-17 NOTE — Telephone Encounter (Signed)
Received paperwork to be completed for the patient's daughter to assist with FMLA for pt's visits and care. Completed the form and will placed in Jessica NP's box for review and signature.

## 2023-06-21 NOTE — Telephone Encounter (Signed)
Signed and placed in outbox.  Thank you. ?

## 2023-06-21 NOTE — Telephone Encounter (Signed)
Will provide the forms to medical records

## 2023-08-17 DIAGNOSIS — H2513 Age-related nuclear cataract, bilateral: Secondary | ICD-10-CM | POA: Diagnosis not present

## 2023-08-17 DIAGNOSIS — H5203 Hypermetropia, bilateral: Secondary | ICD-10-CM | POA: Diagnosis not present

## 2023-08-17 DIAGNOSIS — H52223 Regular astigmatism, bilateral: Secondary | ICD-10-CM | POA: Diagnosis not present

## 2023-08-17 DIAGNOSIS — H524 Presbyopia: Secondary | ICD-10-CM | POA: Diagnosis not present

## 2023-09-07 ENCOUNTER — Other Ambulatory Visit: Payer: Self-pay

## 2023-09-07 DIAGNOSIS — I10 Essential (primary) hypertension: Secondary | ICD-10-CM

## 2023-09-15 DIAGNOSIS — H2513 Age-related nuclear cataract, bilateral: Secondary | ICD-10-CM | POA: Diagnosis not present

## 2023-09-15 DIAGNOSIS — H2511 Age-related nuclear cataract, right eye: Secondary | ICD-10-CM | POA: Diagnosis not present

## 2023-09-15 DIAGNOSIS — Z01818 Encounter for other preprocedural examination: Secondary | ICD-10-CM | POA: Diagnosis not present

## 2023-09-15 DIAGNOSIS — H401111 Primary open-angle glaucoma, right eye, mild stage: Secondary | ICD-10-CM | POA: Diagnosis not present

## 2023-09-20 ENCOUNTER — Telehealth: Payer: Self-pay | Admitting: Cardiology

## 2023-09-20 DIAGNOSIS — I10 Essential (primary) hypertension: Secondary | ICD-10-CM

## 2023-09-20 MED ORDER — ROSUVASTATIN CALCIUM 20 MG PO TABS: 20.0000 mg | ORAL_TABLET | Freq: Every day | ORAL | 1 refills | Status: DC

## 2023-09-20 MED ORDER — HYDROCHLOROTHIAZIDE 12.5 MG PO CAPS
12.5000 mg | ORAL_CAPSULE | Freq: Every morning | ORAL | 1 refills | Status: DC
Start: 2023-09-20 — End: 2024-03-21

## 2023-09-20 MED ORDER — EZETIMIBE 10 MG PO TABS: 10.0000 mg | ORAL_TABLET | Freq: Every day | ORAL | 1 refills | Status: DC

## 2023-09-20 MED ORDER — METOPROLOL SUCCINATE ER 50 MG PO TB24: 50.0000 mg | ORAL_TABLET | Freq: Every day | ORAL | 1 refills | Status: DC

## 2023-09-20 NOTE — Telephone Encounter (Signed)
*  STAT* If patient is at the pharmacy, call can be transferred to refill team.   1. Which medications need to be refilled? (please list name of each medication and dose if known)  ezetimibe (ZETIA) 10 MG tablet hydrochlorothiazide (MICROZIDE) 12.5 MG capsule metoprolol succinate (TOPROL-XL) 50 MG 24 hr tablet rosuvastatin (CRESTOR) 20 MG tablet  2. Which pharmacy/location (including street and city if local pharmacy) is medication to be sent to? [facility] - [place], [place] - [address]  3. Do they need a 30 day or 90 day supply?   90 day supply

## 2023-10-15 DIAGNOSIS — H2511 Age-related nuclear cataract, right eye: Secondary | ICD-10-CM | POA: Diagnosis not present

## 2023-10-15 DIAGNOSIS — G4733 Obstructive sleep apnea (adult) (pediatric): Secondary | ICD-10-CM | POA: Diagnosis not present

## 2023-10-15 DIAGNOSIS — H401111 Primary open-angle glaucoma, right eye, mild stage: Secondary | ICD-10-CM | POA: Diagnosis not present

## 2023-11-04 DIAGNOSIS — E032 Hypothyroidism due to medicaments and other exogenous substances: Secondary | ICD-10-CM | POA: Diagnosis not present

## 2023-11-04 DIAGNOSIS — I10 Essential (primary) hypertension: Secondary | ICD-10-CM | POA: Diagnosis not present

## 2023-11-04 DIAGNOSIS — E782 Mixed hyperlipidemia: Secondary | ICD-10-CM | POA: Diagnosis not present

## 2023-11-10 DIAGNOSIS — M545 Low back pain, unspecified: Secondary | ICD-10-CM | POA: Diagnosis not present

## 2023-11-10 DIAGNOSIS — G309 Alzheimer's disease, unspecified: Secondary | ICD-10-CM | POA: Diagnosis not present

## 2023-11-10 DIAGNOSIS — E032 Hypothyroidism due to medicaments and other exogenous substances: Secondary | ICD-10-CM | POA: Diagnosis not present

## 2023-11-10 DIAGNOSIS — Z0000 Encounter for general adult medical examination without abnormal findings: Secondary | ICD-10-CM | POA: Diagnosis not present

## 2023-11-10 DIAGNOSIS — G4733 Obstructive sleep apnea (adult) (pediatric): Secondary | ICD-10-CM | POA: Diagnosis not present

## 2023-11-10 DIAGNOSIS — E782 Mixed hyperlipidemia: Secondary | ICD-10-CM | POA: Diagnosis not present

## 2023-11-10 DIAGNOSIS — R051 Acute cough: Secondary | ICD-10-CM | POA: Diagnosis not present

## 2023-11-10 DIAGNOSIS — I10 Essential (primary) hypertension: Secondary | ICD-10-CM | POA: Diagnosis not present

## 2023-11-25 DIAGNOSIS — L570 Actinic keratosis: Secondary | ICD-10-CM | POA: Diagnosis not present

## 2023-11-25 DIAGNOSIS — Z85828 Personal history of other malignant neoplasm of skin: Secondary | ICD-10-CM | POA: Diagnosis not present

## 2023-11-25 DIAGNOSIS — Z86007 Personal history of in-situ neoplasm of skin: Secondary | ICD-10-CM | POA: Diagnosis not present

## 2023-11-25 DIAGNOSIS — L821 Other seborrheic keratosis: Secondary | ICD-10-CM | POA: Diagnosis not present

## 2023-11-25 DIAGNOSIS — Z872 Personal history of diseases of the skin and subcutaneous tissue: Secondary | ICD-10-CM | POA: Diagnosis not present

## 2023-11-25 DIAGNOSIS — I788 Other diseases of capillaries: Secondary | ICD-10-CM | POA: Diagnosis not present

## 2023-11-25 DIAGNOSIS — L814 Other melanin hyperpigmentation: Secondary | ICD-10-CM | POA: Diagnosis not present

## 2023-11-25 DIAGNOSIS — Z08 Encounter for follow-up examination after completed treatment for malignant neoplasm: Secondary | ICD-10-CM | POA: Diagnosis not present

## 2023-11-25 DIAGNOSIS — Z85820 Personal history of malignant melanoma of skin: Secondary | ICD-10-CM | POA: Diagnosis not present

## 2023-11-25 DIAGNOSIS — D225 Melanocytic nevi of trunk: Secondary | ICD-10-CM | POA: Diagnosis not present

## 2023-12-15 ENCOUNTER — Other Ambulatory Visit: Payer: Self-pay | Admitting: Adult Health

## 2023-12-16 NOTE — Telephone Encounter (Signed)
 Last seen on 06/10/23 Follow up scheduled on 01/05/24

## 2024-01-04 NOTE — Progress Notes (Unsigned)
 [facility] [address] [place] [place] [address] [phone]       OFFICE FOLLOW UP NOTE  Edward Conway Date of Birth:  09/09/1941 Medical Record Number:  [id number]   Primary neurologist: Dr. Frances Conway Reason for visit: Dementia follow-up    SUBJECTIVE:   CHIEF COMPLAINT:  No chief complaint on file.   HPI:   Update 01/05/2024 JM: Returns for follow-up visit after prior visit 7 months ago.  Reports cognition ***.  Remains on Namenda XR 14 mg daily, overall tolerating well.       History provided for reference purposes only Update 06/09/2023 JM: Patient returns for follow-up visit accompanied by his daughter and caregiver.  Reports cognition has been relatively stable since prior visit.  MMSE today 18/30 (prior 18/30).  Continues on Namenda XR 14mg  daily, does have occasional GI side effects but otherwise tolerating well. He does live alone, has caregiver with him 5-6 days weekly in the morning or when daughter out of town, daughter with him in the evenings and weekends.  Does need assistance with ADLs and IADLs.  Has had a couple episodes of confusion, daughter reports he usually will walk around his home outside but one day, he ended up walking into the woods and got lost, a neighbor ended up finding him and he was noted to be confused.  Daughter is currently looking into a locator type band through [facility].  Denies any behavioral concerns.  Reports good appetite and sleeping well. Will take naps during the day.   Update 12/10/2022 JM: Patient returns for 22-month follow-up accompanied by his daughter.  At prior visit, switched memantine IR to XR formulation due to difficulty remembering twice daily dosing.  Currently taking 14 mg daily.  Tolerating well, at times will have GI issues after taking but not overly bothersome.  Reports cognition relatively stable, more issues with short-term memory. MMSE today 18/30 (21/30 05/2022,  18/30 10/2021) with deficits in orientation, attention/calculation, and recall.    He continues to live alone but now has a caregiver Mon-Fri in the morning, daughter continues to assist him in the evenings.  No behavioral concerns.  Sleeping well.  Appetite improved.  Continues to have incontinence but unchanged since prior visit, was released by urology, now monitored by PCP.   Update 06/09/2022 JM: Patient returns for 4 months cognitive follow-up.  Accompanied by his daughter who provides majority of history.  Increased Namenda dosage with gradual titration but has been only taking 10mg  daily due to difficulty remembering twice daily dosing.  Denies any behavioral concerns.  Continues to live alone but daughter does check on him frequently.  Reports cognition has been stable since prior visit.  MMSE today 21/30 (prior 18/30).  Daughter also notices increased urinary incontinence, does follow with urology and currently on Flomax.  Completed MBS for swallowing concerns which did not show any significant concerns of swallowing or evidence of aspiration. He denies having any issues with swallowing at this time.  No further concerns at this time  Update 02/03/2022 JM: Returns for 68-month follow-up visit regarding memory loss accompanied by his daughter who provides majority of history.  Since prior visit, cognition has been stable. Has continued on Namenda 5mg  nightly - denies side effects. Was previously intolerant to Aricept. He continues to live alone but does have daughter checking on him frequently and assists with IADLs.  Able to maintain ADLs independently. At times, will have difficulty swallowing food and at times will  cough - was seen by GI Sept/Nov 2022 and had esophagus stretching but symptoms have persisted. Daughter also has noticed he may feel overwhelmed and will "shut down" if sees too much food on table/plate.  Daughter sent MyChart message prior to today's visit regarding behaviors consisting of  repetitive hand movements, tapping, vocalizations (beeping noises) and repetitive words.  These were also discussed at prior visit with Dr. Frances Conway. She believes these have been worsening. Does endorse underlying anxiety which has been chronic. Will feel overwhelmed if in small crowded area but does enjoy going to the grocery store and getting out of the house accompanied by his daughter.  No new concerns at this time.  Consult visit 11/04/2021 Dr. Frances Conway (provided for reference purposes only) [name] is an 83 year-old right-handed gentleman with an underlying medical history of hypertension, hyperlipidemia, complex sleep apnea, hypothyroidism, history of prostate cancer, ED, and borderline overweight state, who reports a little about his ongoing symptoms and history, history is primarily provided by his daughter.  She reports a several year history of forgetfulness, repeating himself, and difficulty navigating while driving.  He has not driven a car since summer 2022 because he was getting lost.  He stopped using the donepezil some 6 months ago because of side effects including dizziness, nausea and appetite loss.  His appetite improved after he stopped the donepezil. He does report a family history of.  He lived to be around 78.  Mom lived to be around 33, she did not have any memory loss.  He had 3 brothers and 1 sister, sister had some memory loss, his siblings have passed away.  His wife also passed away a few years ago.  He lives alone, daughter lives close by, son lives in [place].  Patient does not cook, he is able to heat up food.  He would not want to restart using his BiPAP, he had difficulty tolerating it and would forget to use it.  He has not used it in several months or over 1 year.  He has not fallen recently.  He did fall last year and was checked out in the ER per daughter.  He does not have much in the way of physical activity.  He has a dog in the house, but the dog has mobility issues and  the does not typically walk the dog.  He does not always drink enough water, he likes to drink soda, about 2 cans/day, no coffee, rare alcohol, non-smoker.  His daughter has noticed in the past few months repetitive vocalizations and movements almost akin to vocal tics and repetitive motor movements.  He has not had any obvious behavioral or mood related disturbances.   I reviewed your office note from 08/15/2021.  He had blood work through your office on 08/12/2021 and I was able to review the results: CMP showed sodium of 141, potassium 4.4, BUN 10, creatinine 0.95, liver enzymes benign, TSH was 0.78, CBC with platelets and differential showed a borderline elevated hematocrit at 47.5, otherwise normal findings, urinalysis was negative.  Patient was on donepezil 10 mg daily at the time.   He saw Dr. Jyl Heinz with [facility] neurology on 04/22/2021 for initial consultation for his memory loss and I was able to review with that note.  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  He was encouraged to continue with his PAP therapy.   He had a brain  MRI without contrast on 06/26/2019 and I reviewed the results: IMPRESSION: 1.  No acute intracranial abnormality. 2. Signal changes in the cerebral white matter and left caudate compatible with chronic small vessel disease, relatively mild for age.   He had a head CT without contrast on 06/26/2019 and I reviewed the results: IMPRESSION: No acute intracranial process.       I have seen him on 2 occasions for his sleep apnea, with a primary central component. He was not compliant with his BiPAP ST.    The patient's allergies, current medications, family history, past medical history, past social history, past surgical history and problem list were reviewed and updated as appropriate.      ROS:   14 system review of systems performed and negative with  exception of those listed in HPI  PMH:  Past Medical History:  Diagnosis Date   Dementia (HCC)    Hyperlipidemia    Hypertension    OSA on CPAP     PSH:  Past Surgical History:  Procedure Laterality Date   THYROID SURGERY      Social History:  Social History   Socioeconomic History   Marital status: Widowed    Spouse name: Not on file   Number of children: 2   Years of education: Not on file   Highest education level: Professional school degree (e.g., MD, DDS, DVM, JD)  Occupational History    Comment: Tree surgeon  Tobacco Use   Smoking status: Never   Smokeless tobacco: Never  Vaping Use   Vaping status: Never Used  Substance and Sexual Activity   Alcohol use: Not Currently    Comment: occ   Drug use: No   Sexual activity: Not on file  Other Topics Concern   Not on file  Social History Narrative   06/09/22 lives alone   Social Drivers of Health   Financial Resource Strain: Not on file  Food Insecurity: Not on file  Transportation Needs: Not on file  Physical Activity: Not on file  Stress: Not on file  Social Connections: Not on file  Intimate Partner Violence: Not on file    Family History:  Family History  Problem Relation Age of Onset   Colon cancer Mother    Healthy Father    Alzheimer's disease Father    Hypertension Brother     Medications:   Current Outpatient Medications on File Prior to Visit  Medication Sig Dispense Refill   ezetimibe (ZETIA) 10 MG tablet Take 1 tablet (10 mg total) by mouth daily. 90 tablet 1   hydrochlorothiazide (MICROZIDE) 12.5 MG capsule Take 1 capsule (12.5 mg total) by mouth in the morning. 90 capsule 1   levothyroxine (SYNTHROID) 75 MCG tablet Take 75 mcg by mouth daily.     memantine (NAMENDA XR) 21 MG CP24 24 hr capsule TAKE 1 CAPSULE (21 MG TOTAL) BY MOUTH DAILY. 30 capsule 0   metoprolol succinate (TOPROL-XL) 50 MG 24 hr tablet Take 1 tablet (50 mg total) by mouth daily. TAKE WITH OR IMMEDIATELY FOLLOWING  A MEAL. 90 tablet 1   olmesartan (BENICAR) 40 MG tablet Take 1 tablet (40 mg total) by mouth daily. 30 tablet 0   rosuvastatin (CRESTOR) 20 MG tablet Take 1 tablet (20 mg total) by mouth daily. 90 tablet 1   tamsulosin (FLOMAX) 0.4 MG CAPS capsule Take 0.4 mg by mouth daily.     vitamin B-12 (CYANOCOBALAMIN) 1000 MCG tablet 1 tablet     No current facility-administered  medications on file prior to visit.    Allergies:  No Known Allergies    OBJECTIVE:  Physical Exam  There were no vitals filed for this visit.    There is no height or weight on file to calculate BMI. No results found.  General: well developed, well nourished, very pleasant elderly Caucasian male, seated, in no evident distress Head: head normocephalic and atraumatic.   Neck: supple with no carotid or supraclavicular bruits Cardiovascular: regular rate and rhythm, no murmurs Musculoskeletal: no deformity Skin:  no rash/petichiae Vascular:  Normal pulses all extremities   Neurologic Exam Mental Status: Awake and fully alert.  Fluent speech and language. Recent memory impaired and remote memory intact. Attention span, concentration and fund of knowledge impaired. Mood and affect relatively flat with limited participation in conversation unless spoken to.     06/10/2023    1:38 PM 12/10/2022    1:37 PM 06/09/2022    2:34 PM  MMSE - Mini Mental State Exam  Orientation to time 1 0 0  Orientation to Place 5 3 4   Registration 3 3 3   Attention/ Calculation 1 3 5   Recall 0 0 0  Language- name 2 objects 2 2 2   Language- repeat 1 1 1   Language- follow 3 step command 3 3 3   Language- read & follow direction 0 1 1  Write a sentence 1 1 1   Copy design 1 1 1   Total score 18 18 21    Cranial Nerves: Pupils equal, briskly reactive to light. Extraocular movements full without nystagmus. Visual fields full to confrontation. Hearing intact. Facial sensation intact. Face, tongue, palate moves normally and symmetrically.   Motor: Normal bulk and tone. Normal strength in all tested extremity muscles Sensory.: intact to touch , pinprick , position and vibratory sensation.  Coordination: Rapid alternating movements normal in all extremities. Finger-to-nose and heel-to-shin performed accurately bilaterally. Gait and Station: Arises from chair without difficulty. Stance is normal. Gait demonstrates normal stride length and balance without use of AD.  Reflexes: 1+ and symmetric. Toes downgoing.         ASSESSMENT: Edward Conway is a 83 y.o. 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 which has been relatively stable with ongoing use of Namenda, has had a couple confusion episodes   PLAN:  Mixed dementia:  MMSE today 18/30 (prior 18/30) Recommend increasing Namenda XR to 21mg  daily, advised to call with any difficulty tolerating  No behavioral concerns Prior intolerance to Aricept Continue to encourage routine physical and mental activity, ensure good sleep, healthy diet and adequate water intake Daughter requesting FMLA forms to be completed to take off work when needed to assist with doctors visits or if caregiver not available - will complete     Follow up in 6 months or call earlier if needed    CC:  PCP: Merri Brunette, MD    I spent 25 minutes of face-to-face and non-face-to-face time with patient and daughter.  This included previsit chart review, lab review, study review, order entry, electronic health record documentation, patient and daughter education and discussion regarding above diagnoses and treatment plan and answered all other questions to patient and daughter satisfaction   Ihor Austin, [facility]  [facility] [address] [place], [place] [address]  Phone [phone] Fax [phone] Note: This document was prepared with digital dictation and possible smart phrase technology. Any transcriptional errors that result from this process are  unintentional.

## 2024-01-05 ENCOUNTER — Encounter: Payer: Self-pay | Admitting: Adult Health

## 2024-01-05 ENCOUNTER — Ambulatory Visit (INDEPENDENT_AMBULATORY_CARE_PROVIDER_SITE_OTHER): Payer: Medicare Other | Admitting: Adult Health

## 2024-01-05 VITALS — BP 120/84 | HR 82 | Ht 64.0 in | Wt 179.0 lb

## 2024-01-05 DIAGNOSIS — F0150 Vascular dementia without behavioral disturbance: Secondary | ICD-10-CM | POA: Diagnosis not present

## 2024-01-05 DIAGNOSIS — F0280 Dementia in other diseases classified elsewhere without behavioral disturbance: Secondary | ICD-10-CM

## 2024-01-05 DIAGNOSIS — G309 Alzheimer's disease, unspecified: Secondary | ICD-10-CM | POA: Diagnosis not present

## 2024-01-05 MED ORDER — MEMANTINE HCL ER 21 MG PO CP24: 21.0000 mg | ORAL_CAPSULE | Freq: Every day | ORAL | 3 refills | Status: AC

## 2024-01-05 NOTE — Progress Notes (Signed)
 [facility] [address] [place]. [place] [address] [phone]       OFFICE FOLLOW UP NOTE  Edward Conway Date of Birth:  03-26-1941 Medical Record Number:  [id number]   Primary neurologist: Dr. Frances Furbish Reason for visit: Dementia follow-up    SUBJECTIVE:   CHIEF COMPLAINT:  Chief Complaint  Patient presents with   Follow-up    Patient in room #3 and alone. Patient states he is well but still gets up in the middle of the night.     HPI:   Update 01/05/2024 JM:  The patient returns for a follow-up visit regarding memory care. His daughter is with him and gives the history. Patient is an unreliable historian. She reports that his cognition feels about the same, with no significant changes. He continues to tolerate Namenda XR 21 mg daily well with noted improvement with increased dose and having less confusional episodes.  He is currently living alone and family does not report any major issues with daily functioning. He can perform most activities of daily living independently but may need assistance with prompting dressing, especially with clothing changes. He is not currently cooking.  He does have caregiver assistance during the weekdays in the morning. Family plans to work with the Child psychotherapist to initiate physical therapy. His sleep is disrupted, waking up about three times per night.  No recurrent episodes of getting lost, unable to obtain tracking band from [facility] as patients are only eligible if they receive 24-hour supervision.  Daughter did place an airtag in his shoe.  Family reports agitation only during bath time. He has slight shuffling gait but otherwise no issues with mobility and no recent falls. There are no stairs in the home, but he has some difficulty getting off the toilet; his daughter is considering installing handrails. Family and the patient deny any concerns or new issues.      History provided for  reference purposes only Update 06/09/2023 JM: Patient returns for follow-up visit accompanied by his daughter and caregiver.  Reports cognition has been relatively stable since prior visit.  MMSE today 18/30 (prior 18/30).  Continues on Namenda XR 14mg  daily, does have occasional GI side effects but otherwise tolerating well. He does live alone, has caregiver with him 5-6 days weekly in the morning or when daughter out of town, daughter with him in the evenings and weekends.  Does need assistance with ADLs and IADLs.  Has had a couple episodes of confusion, daughter reports he usually will walk around his home outside but one day, he ended up walking into the woods and got lost, a neighbor ended up finding him and he was noted to be confused.  Daughter is currently looking into a locator type band through [facility].  Denies any behavioral concerns.  Reports good appetite and sleeping well. Will take naps during the day.   Update 12/10/2022 JM: Patient returns for 44-month follow-up accompanied by his daughter.  At prior visit, switched memantine IR to XR formulation due to difficulty remembering twice daily dosing.  Currently taking 14 mg daily.  Tolerating well, at times will have GI issues after taking but not overly bothersome.  Reports cognition relatively stable, more issues with short-term memory. MMSE today 18/30 (21/30 05/2022, 18/30 10/2021) with deficits in orientation, attention/calculation, and recall.    He continues to live alone but now has a caregiver Mon-Fri in the morning, daughter continues to assist him in the evenings.  No behavioral concerns.  Sleeping well.  Appetite improved.  Continues to have incontinence but unchanged since prior visit, was released by urology, now monitored by PCP.   Update 06/09/2022 JM: Patient returns for 4 months cognitive follow-up.  Accompanied by his daughter who provides majority of history.  Increased Namenda dosage with gradual  titration but has been only taking 10mg  daily due to difficulty remembering twice daily dosing.  Denies any behavioral concerns.  Continues to live alone but daughter does check on him frequently.  Reports cognition has been stable since prior visit.  MMSE today 21/30 (prior 18/30).  Daughter also notices increased urinary incontinence, does follow with urology and currently on Flomax.  Completed MBS for swallowing concerns which did not show any significant concerns of swallowing or evidence of aspiration. He denies having any issues with swallowing at this time.  No further concerns at this time  Update 02/03/2022 JM: Returns for 35-month follow-up visit regarding memory loss accompanied by his daughter who provides majority of history.  Since prior visit, cognition has been stable. Has continued on Namenda 5mg  nightly - denies side effects. Was previously intolerant to Aricept. He continues to live alone but does have daughter checking on him frequently and assists with IADLs.  Able to maintain ADLs independently. At times, will have difficulty swallowing food and at times will cough - was seen by GI Sept/Nov 2022 and had esophagus stretching but symptoms have persisted. Daughter also has noticed he may feel overwhelmed and will "shut down" if sees too much food on table/plate.  Daughter sent MyChart message prior to today's visit regarding behaviors consisting of repetitive hand movements, tapping, vocalizations (beeping noises) and repetitive words.  These were also discussed at prior visit with Dr. Frances Furbish. She believes these have been worsening. Does endorse underlying anxiety which has been chronic. Will feel overwhelmed if in small crowded area but does enjoy going to the grocery store and getting out of the house accompanied by his daughter.  No new concerns at this time.  Consult visit 11/04/2021 Dr. Frances Furbish (provided for reference purposes only) Edward Conway is an 83 year-old right-handed gentleman with  an underlying medical history of hypertension, hyperlipidemia, complex sleep apnea, hypothyroidism, history of prostate cancer, ED, and borderline overweight state, who reports a little about his ongoing symptoms and history, history is primarily provided by his daughter.  She reports a several year history of forgetfulness, repeating himself, and difficulty navigating while driving.  He has not driven a car since summer 2022 because he was getting lost.  He stopped using the donepezil some 6 months ago because of side effects including dizziness, nausea and appetite loss.  His appetite improved after he stopped the donepezil. He does report a family history of.  He lived to be around 78.  Mom lived to be around 25, she did not have any memory loss.  He had 3 brothers and 1 sister, sister had some memory loss, his siblings have passed away.  His wife also passed away a few years ago.  He lives alone, daughter lives close by, son lives in [place].  Patient does not cook, he is able to heat up food.  He would not want to restart using his BiPAP, he had difficulty tolerating it and would forget to use it.  He has not used it in several months or over 1 year.  He has not fallen recently.  He did fall last year and was checked out in the ER per  daughter.  He does not have much in the way of physical activity.  He has a dog in the house, but the dog has mobility issues and the does not typically walk the dog.  He does not always drink enough water, he likes to drink soda, about 2 cans/day, no coffee, rare alcohol, non-smoker.  His daughter has noticed in the past few months repetitive vocalizations and movements almost akin to vocal tics and repetitive motor movements.  He has not had any obvious behavioral or mood related disturbances.   I reviewed your office note from 08/15/2021.  He had blood work through your office on 08/12/2021 and I was able to review the results: CMP showed sodium of 141, potassium 4.4, BUN 10,  creatinine 0.95, liver enzymes benign, TSH was 0.78, CBC with platelets and differential showed a borderline elevated hematocrit at 47.5, otherwise normal findings, urinalysis was negative.  Patient was on donepezil 10 mg daily at the time.   He saw Dr. Jyl Heinz with [facility] neurology on 04/22/2021 for initial consultation for his memory loss and I was able to review with that note.  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  He was encouraged to continue with his PAP therapy.   He had a brain MRI without contrast on 06/26/2019 and I reviewed the results: IMPRESSION: 1.  No acute intracranial abnormality. 2. Signal changes in the cerebral white matter and left caudate compatible with chronic small vessel disease, relatively mild for age.   He had a head CT without contrast on 06/26/2019 and I reviewed the results: IMPRESSION: No acute intracranial process.       I have seen him on 2 occasions for his sleep apnea, with a primary central component. He was not compliant with his BiPAP ST.    The patient's allergies, current medications, family history, past medical history, past social history, past surgical history and problem list were reviewed and updated as appropriate.      ROS:   14 system review of systems performed and negative with exception of those listed in HPI  PMH:  Past Medical History:  Diagnosis Date   Dementia (HCC)    Hyperlipidemia    Hypertension    OSA on CPAP     PSH:  Past Surgical History:  Procedure Laterality Date   THYROID SURGERY      Social History:  Social History   Socioeconomic History   Marital status: Widowed    Spouse name: Not on file   Number of children: 2   Years of education: Not on file   Highest education level: Professional school degree (e.g., MD, DDS, DVM, JD)  Occupational History    Comment: Tree surgeon  Tobacco  Use   Smoking status: Never   Smokeless tobacco: Never  Vaping Use   Vaping status: Never Used  Substance and Sexual Activity   Alcohol use: Not Currently    Comment: occ   Drug use: No   Sexual activity: Not on file  Other Topics Concern   Not on file  Social History Narrative   06/09/22 lives alone   Social Drivers of Health   Financial Resource Strain: Not on file  Food Insecurity: Not on file  Transportation Needs: Not on file  Physical Activity: Not on file  Stress: Not on file  Social Connections: Not on file  Intimate Partner Violence: Not on file  Family History:  Family History  Problem Relation Age of Onset   Colon cancer Mother    Healthy Father    Alzheimer's disease Father    Hypertension Brother     Medications:   Current Outpatient Medications on File Prior to Visit  Medication Sig Dispense Refill   ezetimibe (ZETIA) 10 MG tablet Take 1 tablet (10 mg total) by mouth daily. 90 tablet 1   hydrochlorothiazide (MICROZIDE) 12.5 MG capsule Take 1 capsule (12.5 mg total) by mouth in the morning. 90 capsule 1   levothyroxine (SYNTHROID) 75 MCG tablet Take 75 mcg by mouth daily.     memantine (NAMENDA XR) 21 MG CP24 24 hr capsule TAKE 1 CAPSULE (21 MG TOTAL) BY MOUTH DAILY. 30 capsule 0   metoprolol succinate (TOPROL-XL) 50 MG 24 hr tablet Take 1 tablet (50 mg total) by mouth daily. TAKE WITH OR IMMEDIATELY FOLLOWING A MEAL. 90 tablet 1   olmesartan (BENICAR) 40 MG tablet Take 1 tablet (40 mg total) by mouth daily. 30 tablet 0   rosuvastatin (CRESTOR) 20 MG tablet Take 1 tablet (20 mg total) by mouth daily. 90 tablet 1   tamsulosin (FLOMAX) 0.4 MG CAPS capsule Take 0.4 mg by mouth daily.     vitamin B-12 (CYANOCOBALAMIN) 1000 MCG tablet 1 tablet (Patient not taking: Reported on 01/05/2024)     No current facility-administered medications on file prior to visit.    Allergies:  No Known Allergies    OBJECTIVE:  Physical Exam  Vitals:   01/05/24 1455   BP: 120/84  Pulse: 82  Weight: 179 lb (81.2 kg)  Height: 5\' 4"  (1.626 m)      Body mass index is 30.73 kg/m. No results found.  General: well developed, well nourished, very pleasant elderly Caucasian male, seated, in no evident distress Head: head normocephalic and atraumatic.   Neck: supple with no carotid or supraclavicular bruits Cardiovascular: regular rate and rhythm, no murmurs Musculoskeletal: no deformity. Strength 5+ in all extremities. Skin:  no rash/petichiae Vascular:  Normal pulses all extremities   Neurologic Exam Mental Status: Awake and fully alert.  Fluent speech and language. Recent memory impaired and remote memory intact. Attention span, concentration and fund of knowledge impaired. Mood and affect relatively flat with limited participation in conversation unless spoken to. Slight difficulty following instruction.     01/05/2024    3:01 PM 06/10/2023    1:38 PM 12/10/2022    1:37 PM  MMSE - Mini Mental State Exam  Orientation to time 2 1 0  Orientation to Place 5 5 3   Registration 3 3 3   Attention/ Calculation 4 1 3   Recall 0 0 0  Language- name 2 objects 2 2 2   Language- repeat 1 1 1   Language- follow 3 step command 3 3 3   Language- read & follow direction 1 0 1  Write a sentence 1 1 1   Copy design 0 1 1  Total score 22 18 18    Cranial Nerves: Pupils equal, briskly reactive to light. Extraocular movements full without nystagmus. Visual fields full to confrontation. Hearing intact. Facial sensation intact. Face, tongue, palate moves normally and symmetrically.  Motor: Normal bulk and tone. Normal strength in all tested extremity muscles. Gait and Station: Arises from chair without difficulty. Stance is normal. Gait demonstrates normal stride length and balance without use of AD.  Reflexes: Toes downgoing.         ASSESSMENT: Edward Conway is a 83 y.o. 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      PLAN:  Mixed dementia:  MMSE today 22/30 (prior 18/30) Continue Namenda XR to 21mg  daily, advised to call with any difficulty tolerating  Consider trial of Rexulti if agitation or wandering worsens, additional information provided to daughter Continue to encourage routine physical and mental activity, ensure good sleep, healthy diet and adequate water intake      Follow up in 1 year or call earlier if needed    CC:  PCP: Merri Brunette, MD    I spent 25 minutes of face-to-face and non-face-to-face time with patient and daughter.  This included previsit chart review, lab review, study review, order entry, electronic health record documentation, patient and daughter education and discussion regarding above diagnoses and treatment plan and answered all other questions to patient and daughter satisfaction  Ihor Austin, [facility]  [facility] [address] [place], [place] [address]  Phone [phone] Fax [phone] Note: This document was prepared with digital dictation and possible smart phrase technology. Any transcriptional errors that result from this process are unintentional.

## 2024-01-05 NOTE — Patient Instructions (Addendum)
 Your Plan:  Continue Namenda XR 14mg  daily   Consider Rexulti which can help reduce wandering episodes     Follow up in 1 year or call earlier if needed     Thank you for coming to see [place] at [facility]. I hope we have been able to provide you high quality care today.  You may receive a patient satisfaction survey over the next few weeks. We would appreciate your feedback and comments so that we may continue to improve ourselves and the health of our patients.    Brexpiprazole Tablets What is this medication? BREXPIPRAZOLE (brex PIP ray zole) treats schizophrenia. It may also be used with antidepressant medication to treat depression. It can also be used to treat agitation caused by Alzheimer disease. It works by balancing the levels of dopamine and serotonin in your brain, substances that help regulate mood, behaviors, and thoughts. It belongs to a group of medications called antipsychotics. Antipsychotic medications can be used to treat several kinds of mental health conditions. This medicine may be used for other purposes; ask your health care provider or pharmacist if you have questions. COMMON BRAND NAME(S): REXULTI What should I tell my care team before I take this medication? They need to know if you have any of these conditions: Dementia Diabetes Have trouble controlling your muscles Heart disease High cholesterol History of breast cancer History of stroke Kidney disease Liver disease Low blood cell levels (white cells, red cells, and platelets) Low blood pressure Parkinson disease Seizures Suicidal thoughts, plans, or attempt by you or a family member Trouble swallowing Urges to engage in impulsive behaviors in ways that are unusual for you An unusual or allergic reaction to brexpiprazole, other medications, foods, dyes, or preservatives Pregnant or trying to get pregnant Breastfeeding How should I use this medication? Take this medication by  mouth with water. Take it as directed on the prescription label at the same time every day. You can take it with or without food. If it upsets your stomach, take it with food. Keep taking this medication unless your care team tells you to stop. Stopping it too quickly can cause serious side effects. It can also make your condition worse. A special MedGuide will be given to you by the pharmacist with each prescription and refill. Be sure to read this information carefully each time. Talk to your care team about the use of this medication in children. While it may be prescribed for children as young as 13 years for selected conditions, precautions do apply. Overdosage: If you think you have taken too much of this medicine contact a poison control center or emergency room at once. NOTE: This medicine is only for you. Do not share this medicine with others. What if I miss a dose? If you miss a dose, take it as soon as you can. If it is almost time for your next dose, take only that dose. Do not take double or extra doses. What may interact with this medication? Do not take this medication with any of the following: Aripiprazole Metoclopramide This medication may also interact with the following: Antihistamines for allergy, cough, and cold Certain medications for anxiety or sleep Certain medications for depression, such as amitriptyline, duloxetine, fluoxetine, paroxetine, sertraline Certain medications for fungal infections, such as fluconazole, itraconazole, ketoconazole Certain medications for Parkinson disease, such as levodopa Clarithromycin General anesthetics, such as halothane, isoflurane, methoxyflurane, propofol Medications for blood pressure Medications that relax muscles for surgery Medications for seizures Opioid medications  for pain Phenothiazines, such as chlorpromazine, prochlorperazine, thioridazine Quinidine Rifampin St. John's wort This list may not describe all possible  interactions. Give your health care provider a list of all the medicines, herbs, non-prescription drugs, or dietary supplements you use. Also tell them if you smoke, drink alcohol, or use illegal drugs. Some items may interact with your medicine. What should I watch for while using this medication? Visit your care team for regular checks on your progress. Tell your care team if your symptoms do not start to get better or if they get worse. Do not suddenly stop taking this medication. You may develop a severe reaction. Your care team will tell you how much medication to take. If your care team wants you to stop the medication, the dose may be slowly lowered over time to avoid any side effects. This medication may cause thoughts of suicide or depression. This includes sudden changes in mood, behaviors, or thoughts. These changes can happen at any time but are more common in the beginning of treatment or after a change in dose. Call your care team right away if you experience these thoughts or worsening depression. This medication may affect your coordination, reaction time, or judgment. Do not drive or operate machinery until you know how this medication affects you. Sit up or stand slowly to reduce the risk of dizzy or fainting spells. Drinking alcohol with this medication can increase the risk of these side effects. There have been reports of increased sexual urges or other strong urges, such as gambling while taking this medication. If you experience any of these while taking this medication, you should report this to your care team as soon as possible. This medication may cause dry eyes and blurred vision. If you wear contact lenses, you may feel some discomfort. Lubricating eye drops may help. See your care team if the problem does not go away or is severe. This medication may increase blood sugar. Ask your care team if changes in diet or medications are needed if you have diabetes. This medication can  cause problems with controlling your body temperature. It can lower the response of your body to cold temperatures. If possible, stay indoors during cold weather. If you must go outdoors, wear warm clothes. It can also lower the response of your body to heat. Do not overheat. Do not over-exercise. Stay out of the sun when possible. If you must be in the sun, wear cool clothing. Drink plenty of water. If you have trouble controlling your body temperature, call your care team right away. What side effects may I notice from receiving this medication? Side effects that you should report to your care team as soon as possible: Allergic reactions--skin rash, itching, hives, swelling of the face, lips, tongue, or throat High blood sugar (hyperglycemia)--increased thirst or amount of urine, unusual weakness or fatigue, blurry vision High fever, stiff muscles, increased sweating, fast or irregular heartbeat, and confusion, which may be signs of neuroleptic malignant syndrome Infection--fever, chills, cough, sore throat Low blood pressure--dizziness, feeling faint or lightheaded, blurry vision Pain or trouble swallowing Seizures Stroke--sudden numbness or weakness of the face, arm, or leg, trouble speaking, confusion, trouble walking, loss of balance or coordination, dizziness, severe headache, change in vision Thoughts of suicide or self-harm, worsening mood, feelings of depression Uncontrolled and repetitive body movements, muscle stiffness or spasms, tremors or shaking, loss of balance or coordination, restlessness, shuffling walk, which may be signs of extrapyramidal symptoms (EPS) Urges to engage in impulsive behaviors  such as gambling, binge eating, sexual activity, or shopping in ways that are unusual for you Side effects that usually do not require medical attention (report to your care team if they continue or are bothersome): Constipation Drowsiness Headache Weight gain This list may not describe  all possible side effects. Call your doctor for medical advice about side effects. You may report side effects to FDA at 1-800-FDA-1088. Where should I keep my medication? Keep out of the reach of children and pets. Store at room temperature between 20 and 25 degrees C (68 and 77 degrees F). Get rid of any unused medication after the expiration date. To get rid of medications that are no longer needed or have expired: Take the medication to a medication take-back program. Check with your pharmacy or law enforcement to find a location. If you cannot return the medication, check the label or package insert to see if the medication should be thrown out in the garbage or flushed down the toilet. If you are not sure, ask your care team. If it is safe to put it in the trash, take the medication out of the container. Mix the medication with cat litter, dirt, coffee grounds, or other unwanted substance. Seal the mixture in a bag or container. Put it in the trash. NOTE: This sheet is a summary. It may not cover all possible information. If you have questions about this medicine, talk to your doctor, pharmacist, or health care provider.  2024 Elsevier/Gold Standard (2023-03-29 00:00:00)

## 2024-02-25 ENCOUNTER — Ambulatory Visit: Payer: Self-pay | Admitting: Cardiology

## 2024-03-20 ENCOUNTER — Other Ambulatory Visit: Payer: Self-pay | Admitting: Cardiology

## 2024-03-20 DIAGNOSIS — I10 Essential (primary) hypertension: Secondary | ICD-10-CM

## 2024-04-17 ENCOUNTER — Other Ambulatory Visit: Payer: Self-pay

## 2024-04-17 ENCOUNTER — Other Ambulatory Visit: Payer: Self-pay | Admitting: Cardiology

## 2024-04-17 DIAGNOSIS — I10 Essential (primary) hypertension: Secondary | ICD-10-CM

## 2024-04-17 MED ORDER — EZETIMIBE 10 MG PO TABS: 10.0000 mg | ORAL_TABLET | Freq: Every day | ORAL | 0 refills | Status: DC

## 2024-04-17 MED ORDER — ROSUVASTATIN CALCIUM 20 MG PO TABS: 20.0000 mg | ORAL_TABLET | Freq: Every day | ORAL | 0 refills | Status: DC

## 2024-04-17 MED ORDER — HYDROCHLOROTHIAZIDE 12.5 MG PO CAPS: 12.5000 mg | ORAL_CAPSULE | Freq: Every day | ORAL | 0 refills | Status: DC

## 2024-04-24 ENCOUNTER — Other Ambulatory Visit: Payer: Self-pay | Admitting: Cardiology

## 2024-05-08 ENCOUNTER — Other Ambulatory Visit: Payer: Self-pay | Admitting: Cardiology

## 2024-05-31 ENCOUNTER — Other Ambulatory Visit: Payer: Self-pay | Admitting: Cardiology

## 2024-05-31 DIAGNOSIS — I10 Essential (primary) hypertension: Secondary | ICD-10-CM

## 2024-07-19 DIAGNOSIS — Z86007 Personal history of in-situ neoplasm of skin: Secondary | ICD-10-CM | POA: Diagnosis not present

## 2024-07-19 DIAGNOSIS — L821 Other seborrheic keratosis: Secondary | ICD-10-CM | POA: Diagnosis not present

## 2024-07-19 DIAGNOSIS — Z85820 Personal history of malignant melanoma of skin: Secondary | ICD-10-CM | POA: Diagnosis not present

## 2024-07-19 DIAGNOSIS — D044 Carcinoma in situ of skin of scalp and neck: Secondary | ICD-10-CM | POA: Diagnosis not present

## 2024-07-19 DIAGNOSIS — D492 Neoplasm of unspecified behavior of bone, soft tissue, and skin: Secondary | ICD-10-CM | POA: Diagnosis not present

## 2024-07-19 DIAGNOSIS — L570 Actinic keratosis: Secondary | ICD-10-CM | POA: Diagnosis not present

## 2024-07-19 DIAGNOSIS — D1801 Hemangioma of skin and subcutaneous tissue: Secondary | ICD-10-CM | POA: Diagnosis not present

## 2024-07-19 DIAGNOSIS — Z872 Personal history of diseases of the skin and subcutaneous tissue: Secondary | ICD-10-CM | POA: Diagnosis not present

## 2024-07-19 DIAGNOSIS — Z08 Encounter for follow-up examination after completed treatment for malignant neoplasm: Secondary | ICD-10-CM | POA: Diagnosis not present

## 2024-07-19 DIAGNOSIS — Z85828 Personal history of other malignant neoplasm of skin: Secondary | ICD-10-CM | POA: Diagnosis not present

## 2024-07-19 DIAGNOSIS — L814 Other melanin hyperpigmentation: Secondary | ICD-10-CM | POA: Diagnosis not present

## 2025-01-09 ENCOUNTER — Ambulatory Visit: Admitting: Adult Health
